# Patient Record
Sex: Male | Born: 1965 | Race: White | Hispanic: No | Marital: Single | Smoking: Former smoker
Health system: Southern US, Community
[De-identification: ages and names within clinical notes are randomized; demographics above are authoritative.]

## PROBLEM LIST (undated history)

## (undated) DIAGNOSIS — T7840XA Allergy, unspecified, initial encounter: Secondary | ICD-10-CM

## (undated) DIAGNOSIS — F419 Anxiety disorder, unspecified: Secondary | ICD-10-CM

## (undated) DIAGNOSIS — D649 Anemia, unspecified: Secondary | ICD-10-CM

## (undated) DIAGNOSIS — G5600 Carpal tunnel syndrome, unspecified upper limb: Secondary | ICD-10-CM

## (undated) DIAGNOSIS — M797 Fibromyalgia: Secondary | ICD-10-CM

## (undated) DIAGNOSIS — G47419 Narcolepsy without cataplexy: Secondary | ICD-10-CM

## (undated) DIAGNOSIS — F32A Depression, unspecified: Secondary | ICD-10-CM

## (undated) DIAGNOSIS — F329 Major depressive disorder, single episode, unspecified: Secondary | ICD-10-CM

## (undated) DIAGNOSIS — I10 Essential (primary) hypertension: Secondary | ICD-10-CM

## (undated) DIAGNOSIS — G43909 Migraine, unspecified, not intractable, without status migrainosus: Secondary | ICD-10-CM

## (undated) DIAGNOSIS — G709 Myoneural disorder, unspecified: Secondary | ICD-10-CM

## (undated) HISTORY — PX: GASTRIC BYPASS: SHX52

## (undated) HISTORY — DX: Anemia, unspecified: D64.9

## (undated) HISTORY — DX: Anxiety disorder, unspecified: F41.9

## (undated) HISTORY — DX: Essential (primary) hypertension: I10

## (undated) HISTORY — DX: Allergy, unspecified, initial encounter: T78.40XA

## (undated) HISTORY — DX: Depression, unspecified: F32.A

## (undated) HISTORY — DX: Fibromyalgia: M79.7

## (undated) HISTORY — DX: Myoneural disorder, unspecified: G70.9

## (undated) HISTORY — DX: Narcolepsy without cataplexy: G47.419

## (undated) HISTORY — DX: Major depressive disorder, single episode, unspecified: F32.9

## (undated) HISTORY — DX: Carpal tunnel syndrome, unspecified upper limb: G56.00

## (undated) HISTORY — DX: Migraine, unspecified, not intractable, without status migrainosus: G43.909

---

## 1998-02-08 ENCOUNTER — Emergency Department (HOSPITAL_COMMUNITY): Admission: EM | Admit: 1998-02-08 | Discharge: 1998-02-08 | Payer: Self-pay | Admitting: Emergency Medicine

## 1998-11-09 ENCOUNTER — Ambulatory Visit (HOSPITAL_COMMUNITY): Admission: RE | Admit: 1998-11-09 | Discharge: 1998-11-09 | Payer: Self-pay

## 1998-11-09 ENCOUNTER — Encounter: Payer: Self-pay | Admitting: Family Medicine

## 1999-08-16 HISTORY — PX: GASTRIC BYPASS: SHX52

## 1999-10-17 ENCOUNTER — Encounter: Payer: Self-pay | Admitting: Emergency Medicine

## 1999-10-17 ENCOUNTER — Emergency Department (HOSPITAL_COMMUNITY): Admission: EM | Admit: 1999-10-17 | Discharge: 1999-10-17 | Payer: Self-pay | Admitting: Emergency Medicine

## 1999-10-18 ENCOUNTER — Emergency Department (HOSPITAL_COMMUNITY): Admission: EM | Admit: 1999-10-18 | Discharge: 1999-10-19 | Payer: Self-pay | Admitting: Emergency Medicine

## 1999-11-20 ENCOUNTER — Encounter: Payer: Self-pay | Admitting: Internal Medicine

## 1999-11-20 ENCOUNTER — Emergency Department (HOSPITAL_COMMUNITY): Admission: EM | Admit: 1999-11-20 | Discharge: 1999-11-20 | Payer: Self-pay | Admitting: Internal Medicine

## 1999-11-22 ENCOUNTER — Emergency Department (HOSPITAL_COMMUNITY): Admission: EM | Admit: 1999-11-22 | Discharge: 1999-11-22 | Payer: Self-pay | Admitting: Emergency Medicine

## 1999-11-22 ENCOUNTER — Encounter: Payer: Self-pay | Admitting: Emergency Medicine

## 1999-11-22 ENCOUNTER — Encounter: Admission: RE | Admit: 1999-11-22 | Discharge: 1999-11-22 | Payer: Self-pay | Admitting: Urology

## 1999-11-22 ENCOUNTER — Encounter: Payer: Self-pay | Admitting: Urology

## 1999-11-25 ENCOUNTER — Ambulatory Visit (HOSPITAL_COMMUNITY): Admission: RE | Admit: 1999-11-25 | Discharge: 1999-11-25 | Payer: Self-pay | Admitting: Urology

## 1999-11-25 ENCOUNTER — Encounter: Payer: Self-pay | Admitting: Urology

## 1999-11-25 ENCOUNTER — Emergency Department (HOSPITAL_COMMUNITY): Admission: EM | Admit: 1999-11-25 | Discharge: 1999-11-25 | Payer: Self-pay | Admitting: Emergency Medicine

## 1999-12-02 ENCOUNTER — Encounter: Admission: RE | Admit: 1999-12-02 | Discharge: 1999-12-02 | Payer: Self-pay | Admitting: Urology

## 1999-12-02 ENCOUNTER — Encounter: Payer: Self-pay | Admitting: Urology

## 2009-12-24 ENCOUNTER — Ambulatory Visit (HOSPITAL_COMMUNITY): Admission: RE | Admit: 2009-12-24 | Discharge: 2009-12-24 | Payer: Self-pay | Admitting: Urology

## 2010-05-18 ENCOUNTER — Ambulatory Visit: Payer: Self-pay | Admitting: Pulmonary Disease

## 2010-05-18 DIAGNOSIS — G4710 Hypersomnia, unspecified: Secondary | ICD-10-CM

## 2010-05-18 DIAGNOSIS — G4730 Sleep apnea, unspecified: Secondary | ICD-10-CM | POA: Insufficient documentation

## 2010-05-21 ENCOUNTER — Telehealth: Payer: Self-pay | Admitting: Pulmonary Disease

## 2010-05-24 ENCOUNTER — Telehealth: Payer: Self-pay | Admitting: Pulmonary Disease

## 2010-05-31 ENCOUNTER — Encounter: Payer: Self-pay | Admitting: Pulmonary Disease

## 2010-06-14 ENCOUNTER — Encounter: Payer: Self-pay | Admitting: Pulmonary Disease

## 2010-06-14 ENCOUNTER — Ambulatory Visit (HOSPITAL_BASED_OUTPATIENT_CLINIC_OR_DEPARTMENT_OTHER): Admission: RE | Admit: 2010-06-14 | Discharge: 2010-06-14 | Payer: Self-pay | Admitting: Pulmonary Disease

## 2010-06-15 ENCOUNTER — Ambulatory Visit: Payer: Self-pay | Admitting: Pulmonary Disease

## 2010-06-15 ENCOUNTER — Telehealth: Payer: Self-pay | Admitting: Pulmonary Disease

## 2010-06-22 ENCOUNTER — Ambulatory Visit: Payer: Self-pay | Admitting: Pulmonary Disease

## 2010-06-28 ENCOUNTER — Ambulatory Visit: Payer: Self-pay | Admitting: Pulmonary Disease

## 2010-06-28 DIAGNOSIS — G47411 Narcolepsy with cataplexy: Secondary | ICD-10-CM | POA: Insufficient documentation

## 2010-06-28 DIAGNOSIS — G8929 Other chronic pain: Secondary | ICD-10-CM

## 2010-08-16 ENCOUNTER — Encounter
Admission: RE | Admit: 2010-08-16 | Discharge: 2010-08-16 | Payer: Self-pay | Source: Home / Self Care | Attending: Physical Medicine & Rehabilitation | Admitting: Physical Medicine & Rehabilitation

## 2010-08-16 ENCOUNTER — Ambulatory Visit: Payer: Self-pay | Admitting: Physical Medicine & Rehabilitation

## 2010-08-30 ENCOUNTER — Encounter
Admission: RE | Admit: 2010-08-30 | Discharge: 2010-09-14 | Payer: Self-pay | Source: Home / Self Care | Attending: Physical Medicine & Rehabilitation | Admitting: Physical Medicine & Rehabilitation

## 2010-09-01 ENCOUNTER — Ambulatory Visit (HOSPITAL_COMMUNITY)
Admission: RE | Admit: 2010-09-01 | Discharge: 2010-09-01 | Payer: Self-pay | Source: Home / Self Care | Attending: Physical Medicine & Rehabilitation | Admitting: Physical Medicine & Rehabilitation

## 2010-09-02 ENCOUNTER — Encounter: Payer: Self-pay | Admitting: Pulmonary Disease

## 2010-09-03 ENCOUNTER — Ambulatory Visit
Admission: RE | Admit: 2010-09-03 | Discharge: 2010-09-03 | Payer: Self-pay | Source: Home / Self Care | Attending: Physical Medicine & Rehabilitation | Admitting: Physical Medicine & Rehabilitation

## 2010-09-12 LAB — CONVERTED CEMR LAB
Amphetamine Screen, Ur: POSITIVE — AB
Barbiturate Quant, Ur: NEGATIVE
Cocaine Metabolites: NEGATIVE
Ethyl Alcohol: <10 mg/dL (ref <10)
Marijuana Metabolite: NEGATIVE
Methadone: NEGATIVE
Phencyclidine (PCP): NEGATIVE
Propoxyphene: NEGATIVE

## 2010-09-14 NOTE — Letter (Signed)
Summary: [facility]  [facility]   Imported By: Lester [place] 06/10/2010 08:36:17  _____________________________________________________________________  External Attachment:    Type:   Image     Comment:   External Document

## 2010-09-14 NOTE — Progress Notes (Signed)
Summary: C PAP MACHINE  Phone Note Call from Patient Call back at [phone] OR [phone]   Caller: Patient Call For: ALVA Summary of Call: PT CALLING ABOUT C PAP MACHINE Initial call taken by: Rickard Patience,  May 24, 2010 2:41 PM  Follow-up for Phone Call        spoke to [email or web address]  she found the order pt should be called on 05/25/10 Follow-up by: Oneita Jolly,  May 24, 2010 5:08 PM  Additional Follow-up for Phone Call Additional follow up Details #1::        called and spoke with pts mother and she is aware that this will be taken care of in the am by [facility]--explained to her if she did not hear from them by tomorrow afternoon to call us back. Randell Loop CMA  May 24, 2010 5:10 PM

## 2010-09-14 NOTE — Assessment & Plan Note (Signed)
Summary: sleep consult//jrc   Visit Type:  Initial Consult Copy to:  self Primary Provider/Referring Provider:  Dr.Denise Providence Lanius  CC:  Pt here for sleep consult.  History of Present Illness: 44/M for evaluation of obstructive sleep apnea . He is originally from here, moved back from [facility] to [place] 2 yrs ago, planning to move to [place]  - wife works for [employer]. His supplier was [facility] in [place]. PSG in 2006 , CPAP 11.5  with pillows, broke down a week ago, plastic broke off the hose,  c/o slurred speech since too sleepy, He is concerned that he may have narcolepsy -   no energy, weight gain - psychiatrist, Dr Milagros Evener. - 272 1972 med history - dependent on xanax, now on valium since father passed away lyrica for joint pain on symbyax for depression, ? bipolar  He reports history suggestive of cataplexy -fell down stairs x1, startled - fell out of bed - trying to get up from sleep at 3 A, He describes an episode when wife was ringing door bell, he was surprised & lost balance.  Epworth Sleepiness Score 15. Bedtime around MN, latency 8 mins, wakes up 1-2 times, oob at 0630, with headache. Takes 3-4 goody powder/ day Has gained 40 lbs in the last year . He lost from 310 to 10 lbs after his bypass, but regained.  Preventive Screening-Counseling & Management  Alcohol-Tobacco     Alcohol drinks/day: <1     Smoking Status: current     Packs/Day: 1.5     Year Started: 1984   History of Present Illness: current sleep apnea with broken machine from 2006 [place])  What time do you typically go to bed?(between what hours): 11:30-12  How long does it take you to fall asleep?  How many times during the night do you wake up? 1-2  What time do you get out of bed to start your day? 6:30am  Do you drive or operate heavy machinery in your occupation? n/a  How much has your weight changed (up or down) over the past two years? (in pounds): gained 40lbs  Have you ever had a sleep  study before?  If yes,[place] and [facility] [place]  Do you currently use CPAP ? If so , at what pressure? yes 11.5   Do you wear oxygen at any time? If yes, how many liters per minute? no Current Medications (verified): 1)  Goodys Extra Strength 520-260-32.5 Mg Pack (Aspirin-Acetaminophen-Caffeine) .... 4 To 6 Daily As Needed 2)  Lyrica 75 Mg Caps (Pregabalin) .... Take 1 Tablet By Mouth Every Morning and Take 2 Tab By Mouth At Bedtime 3)  Symbyax 6-25 Mg Caps (Olanzapine-Fluoxetine Hcl) .... Take 1 Tablet By Mouth Once A Day 4)  Nuvigil 250 Mg Tabs (Armodafinil) .... Take 1 Tablet By Mouth Every Morning 5)  Propranolol Hcl 20 Mg Tabs (Propranolol Hcl) .... Take 1 Tablet By Mouth Once A Day As Needed 6)  Trazodone Hcl 150 Mg Tabs (Trazodone Hcl) .... Take 1/2 Tab By Mouth At Bedtime 7)  Valium 10 Mg Tabs (Diazepam) .... Take  1 Tablet By Mouth Up To Four Times A Day 8)  Cpap .Marland Kitchen.. 11.5 9)  Ritalin 5 Mg Tabs (Methylphenidate Hcl) .... 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  Packs/Day:  1.5 Smoking Status:  current Alcohol drinks/day:  <1  Review of Systems       The patient complains of productive cough, acid heartburn, abdominal pain, tooth/dental problems, headaches, nasal congestion/difficulty breathing through nose, sneezing, itching, ear ache, anxiety, depression, and joint stiffness or pain.  The patient denies shortness of breath with activity, shortness of breath at  rest, non-productive cough, coughing up blood, chest pain, irregular heartbeats, indigestion, loss of appetite, weight change, difficulty swallowing, sore throat, hand/feet swelling, rash, change in color of mucus, and fever.    Vital Signs:  Patient profile:   45 year old male Height:      70 inches Weight:      283 pounds BMI:     40.75 O2 Sat:      98 % on Room air Temp:     98.8 degrees F oral Pulse rate:   93 / minute BP sitting:   126 / 80  (left arm) Cuff size:   regular  Vitals Entered By: Zackery Barefoot CMA (May 18, 2010 9:50 AM)  O2 Flow:  Room air CC: Pt here for sleep consult Comments Medications reviewed with patient Verified contact number and pharmacy with patient Zackery Barefoot CMA  May 18, 2010 9:50 AM    Physical Exam  Additional Exam:  wt 283 May 18, 2010  Gen. obese, sleepy, in no distress, normal affect ENT - no lesions, no post nasal drip, class 3 airway Neck: No JVD, no thyromegaly, no carotid bruits Lungs: no use of accessory muscles, no dullness to percussion, clear without rales or rhonchi  Cardiovascular: Rhythm regular, heart sounds  normal, no murmurs or gallops, no peripheral edema Abdomen: soft and non-tender, no hepatosplenomegaly, BS normal. Musculoskeletal: No deformities, no cyanosis or clubbing Neuro:  alert, non focal     Impression & Recommendations:  Problem # 1:  SLEEP APNEA (ICD-780.57) Since we are unable to obtian his studies , will have to redefine . The pathophysiology of obstructive sleep apnea, it's cardiovascular consequences and modes of treatment including CPAP were discussed with the patient in great detail.  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 Does not need nuvigil in addition to  ritalin.  Orders: Consultation Level IV (60454)  Medications Added to Medication List This Visit: 1)  Goodys Extra Strength 520-260-32.5 Mg Pack (Aspirin-acetaminophen-caffeine) .... 4 to 6 daily as needed 2)  Lyrica 75 Mg Caps (Pregabalin) .... Take 1 tablet by mouth every morning and take 2 tab by mouth at bedtime 3)  Symbyax 6-25 Mg Caps (Olanzapine-fluoxetine hcl) .... Take 1 tablet by mouth once a day 4)  Nuvigil 250 Mg Tabs (Armodafinil) .... Take 1 tablet by mouth every morning 5)  Propranolol Hcl 20 Mg Tabs (Propranolol hcl) .... Take 1 tablet by mouth once a day as needed 6)  Trazodone Hcl 150 Mg Tabs (Trazodone hcl) .... Take 1/2 tab by mouth at bedtime 7)  Valium 10 Mg Tabs (Diazepam) .... Take  1 tablet by mouth up to four times a day 8)  Cpap  .Marland Kitchen.. 11.5 9)  Ritalin  5 Mg Tabs (Methylphenidate hcl) .... 2 tab by mouth up to three times a day  Patient Instructions: 1)  Copy sent to:dr kaur, dr Providence [facility]ius [facility] [place] 2)  Please schedule a follow-up appointment in 1 month after sleep study   Immunization History:  Influenza Immunization History:    Influenza:  historical (05/18/2009)

## 2010-09-14 NOTE — Assessment & Plan Note (Signed)
Summary: rov/pt to arrive @ 9:10am/ok to double per RA/jwr   Visit Type:  Follow-up Copy to:  self Primary Provider/Referring Provider:  Dr.Denise Providence Lanius  CC:  Patient here for sleep study follow-up.Marland Kitchen  History of Present Illness: 44/M for evaluation of obstructive sleep apnea . He is originally from here, moved back from [facility] to [place] 2 yrs ago, planning to move to [place]  - wife works for [employer]. His supplier was [facility] in [place]. PSG in 2006 , CPAP 11.5  with pillows, broke down a week ago, plastic broke off the hose,   med history - dependent on xanax, now on valium since father passed away lyrica for joint pain on symbyax for depression, ? bipolar  He reports history suggestive of cataplexy -fell down stairs x1, startled - fell out of bed - trying to get up from sleep at 3 A, He describes an episode when wife was ringing door bell, he was surprised & lost balance. Epworth Sleepiness Score 15.  Has gained 40 lbs in the last year . He lost from 310 to 10 lbs after his bypass, but regained.  June 28, 2010 10:14 AM  Discussed -PSG surprisingly did no show significant events, no REM noted. MSLT showed very short sleep latency & 2 SOREMs c/w narcolepsy. He does report subjective improvement with CPAP. He has changed to adderall now from ritalin (given by Psych)  Current Medications (verified): 1)  Goodys Extra Strength 520-260-32.5 Mg Pack (Aspirin-Acetaminophen-Caffeine) .... 4 To 6 Daily As Needed 2)  Lyrica 75 Mg Caps (Pregabalin) .... Take 1 Tablet By Mouth Every Morning and Take 2 Tab By Mouth At Bedtime 3)  Symbyax 6-25 Mg Caps (Olanzapine-Fluoxetine Hcl) .... Take 1 Tablet By Mouth Once A Day 4)  Nuvigil 250 Mg Tabs (Armodafinil) .... Take 1 Tablet By Mouth Every Morning 5)  Propranolol Hcl 20 Mg Tabs (Propranolol Hcl) .... Take 1 Tablet By Mouth Once A Day As Needed 6)  Trazodone Hcl 150 Mg Tabs (Trazodone Hcl) .... Take 1/2 Tab By Mouth At Bedtime 7)  Valium 10 Mg Tabs  (Diazepam) .... Take  1 Tablet By Mouth Up To Four Times A Day 8)  Cpap .Marland Kitchen.. 11.5 9)  Ritalin 5 Mg Tabs (Methylphenidate Hcl) .... 2 Tab By Mouth Up To Three Times A Day 10)  Tramadol Hcl 50 Mg Tabs (Tramadol Hcl) .... 2 By Mouth Two Times A Day  Allergies (verified): No Known Drug Allergies  Past History:  Social History: Last updated: 05/18/2010 Marital Status: Married Children: yes Occupation: unemployed Patient is a current smoker.   Risk Factors: Smoking Status: current (05/18/2010) Packs/Day: 1.5 (05/18/2010)  Past Medical History: Sleep Apnea Chronic Headaches psychiatrist, Dr Milagros Evener. - [phone]  Review of Systems  The patient denies anorexia, fever, weight loss, weight gain, vision loss, decreased hearing, hoarseness, chest pain, syncope, dyspnea on exertion, peripheral edema, prolonged cough, headaches, hemoptysis, abdominal pain, melena, hematochezia, severe indigestion/heartburn, hematuria, muscle weakness, suspicious skin lesions, difficulty walking, depression, unusual weight change, abnormal bleeding, enlarged lymph nodes, and angioedema.    Vital Signs:  Patient profile:   45 year old male Height:      70 inches (177.80 cm) Weight:      286.25 pounds (130.11 kg) BMI:     41.22 O2 Sat:      94 % on Room air Temp:     98.1 degrees F (36.72 degrees C) oral Pulse rate:   81 / minute BP sitting:   108 /  70  (left arm) Cuff size:   large  Vitals Entered By: Michel Bickers CMA (June 28, 2010 9:28 AM)  O2 Sat at Rest %:  94 O2 Flow:  Room air CC: Patient here for sleep study follow-up. Comments Medications reviewed with patient Michel Bickers  [facility]  June 28, 2010 9:28 AM   Physical Exam  Additional Exam:  wt 283 May 18, 2010  Gen. 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   Orders: Est. Patient Level IV (95621)  Problem # 2:  OTHER CHRONIC PAIN (ICD-338.29) He requests pain clinic referral. All his psych meds are prescribed by Dr Evelene Croon Orders: Est. Patient Level IV (30865) Pain Clinic Referral (Pain)  Problem # 3:  SLEEP APNEA (ICD-780.57) Surprisingly, obstructive sleep apnea is mild.  However he would like to keep sleeping with his CPAP.  Medications Added to Medication List This Visit: 1)  Tramadol Hcl 50 Mg Tabs (Tramadol hcl) .... 2 by mouth two times a day 2)  Adderall 5 Mg Tabs (Amphetamine-dextroamphetamine) .... 4 times/d ay as needed  Patient Instructions: 1)  Copy sent to: Dr Evelene Croon, [facility] 2)  Please schedule a follow-up appointment in 3 months. 3)  Sleep diary 4)  You may have narcolepsy 5)  Fixed bedtime & wake up times 6)  Frequent naps 1hr at least twice/day

## 2010-09-14 NOTE — Progress Notes (Signed)
Summary: cpap/ parts  Phone Note Call from Patient   Caller: Mom- Kevin Reed Call For: ALVA Summary of Call: pt's mom wants to know status of finding info re: where pt got his cpap yet? pt needs a new auto pap and humidifier. mom concerned that pt's speech is very slurred (she says due to lack of sleep) and needs this cpap asap. says she went over this with nurse at time of visit and they were to call back. [phone] or [phone] Initial call taken by: Tivis Ringer, CNA,  May 21, 2010 10:11 AM  Follow-up for Phone Call        called and spoke with pt's mother, Kevin Reed.  Kevin states pt was just seen by RA on 05/18/2010 for sleep consult.  Kevin states nurse/md were going to try to locate sleep study pt had done in [place].  Kevin calling to check on the status of this.  Will forward message to Shanda Bumps to see if she was able to locate sleep study.  Arman Filter LPN  May 21, 2010 10:40 AM   [facility] in [place] and they advised no record of same dating back to 2000. Another phone note has already addressed pt's machine. Will sign off on this note. Kevin Reed CMA  May 26, 2010 11:05 AM

## 2010-09-14 NOTE — Progress Notes (Signed)
Summary: drug screen  Phone Note Call from Patient   Caller: [facility] Call For: alva Summary of Call: pt had paper that said do drug test but they need an order with dx codes for which test needs to be done [phone] Initial call taken by: Oneita Jolly,  June 15, 2010 12:08 PM  Follow-up for Phone Call        order placed by Dr. Vassie Loll for sleep study states ok to have urine drug screen prior to study. Lab needed this entrered into [facility]. It has been entered. Carron Curie CMA  June 15, 2010 3:22 PM

## 2010-09-15 ENCOUNTER — Ambulatory Visit: Payer: Self-pay | Admitting: Physical Therapy

## 2010-09-16 NOTE — Letter (Signed)
SummaryScience writer Pulmonary Care Appointment Letter  [facility]  [address]   [place], [place] [address]   Phone: [phone]  Fax: [phone]    09/02/2010 MRN: [id number]  Kevin Reed [address] [place], [place]  [address]  Dear [name],   Our office is attempting to contact you about an appointment.  Please call our office at [phone] to schedule this appointment with Dr.Jachai Okazaki.  Our registration staff is prepared to assist you with any questions you may have.    Thank you,   Nature conservation officer Pulmonary Division

## 2010-10-04 ENCOUNTER — Ambulatory Visit: Payer: BC Managed Care – PPO | Attending: Physical Medicine & Rehabilitation | Admitting: Physical Therapy

## 2010-10-04 DIAGNOSIS — M545 Low back pain, unspecified: Secondary | ICD-10-CM | POA: Insufficient documentation

## 2010-10-04 DIAGNOSIS — M25569 Pain in unspecified knee: Secondary | ICD-10-CM | POA: Insufficient documentation

## 2010-10-04 DIAGNOSIS — IMO0001 Reserved for inherently not codable concepts without codable children: Secondary | ICD-10-CM | POA: Insufficient documentation

## 2010-10-04 DIAGNOSIS — M25539 Pain in unspecified wrist: Secondary | ICD-10-CM | POA: Insufficient documentation

## 2010-10-06 ENCOUNTER — Encounter: Payer: Self-pay | Admitting: Pulmonary Disease

## 2010-10-06 ENCOUNTER — Ambulatory Visit (INDEPENDENT_AMBULATORY_CARE_PROVIDER_SITE_OTHER): Payer: BC Managed Care – PPO | Admitting: Pulmonary Disease

## 2010-10-06 DIAGNOSIS — G47411 Narcolepsy with cataplexy: Secondary | ICD-10-CM

## 2010-10-06 DIAGNOSIS — G4730 Sleep apnea, unspecified: Secondary | ICD-10-CM

## 2010-10-08 ENCOUNTER — Ambulatory Visit: Payer: BC Managed Care – PPO | Admitting: Physical Medicine & Rehabilitation

## 2010-10-08 ENCOUNTER — Encounter: Payer: BC Managed Care – PPO | Attending: Physical Medicine & Rehabilitation

## 2010-10-08 DIAGNOSIS — G5600 Carpal tunnel syndrome, unspecified upper limb: Secondary | ICD-10-CM | POA: Insufficient documentation

## 2010-10-08 DIAGNOSIS — G5610 Other lesions of median nerve, unspecified upper limb: Secondary | ICD-10-CM

## 2010-10-11 ENCOUNTER — Telehealth (INDEPENDENT_AMBULATORY_CARE_PROVIDER_SITE_OTHER): Payer: Self-pay | Admitting: *Deleted

## 2010-10-21 ENCOUNTER — Telehealth (INDEPENDENT_AMBULATORY_CARE_PROVIDER_SITE_OTHER): Payer: Self-pay | Admitting: *Deleted

## 2010-10-21 NOTE — Progress Notes (Signed)
  Phone Note Other Incoming   Request: Send information Summary of Call: Received request from [employer], request forwarded to [facility].

## 2010-10-21 NOTE — Assessment & Plan Note (Signed)
Summary: follow up   Copy to:  self Primary Provider/Referring Provider:  Dr.Denise Providence Lanius  CC:  Pt here of follow up. c/o night sweats x 2 weeks and daytime sleepiness.  History of Present Illness: 45/M for FU of excessive daytime somnolence due to narcolepsy & obstructive sleep apnea. He is originally from here, moved back from [facility] to [place] 2 yrs ago, planning to move to [place]  - wife works for [employer]. His supplier was [facility] in [place]. After PSG in 2006 , he has been maintained on CPAP 11.5  with pillows.  med history - dependent on xanax, now on valium since father passed away, lyrica for joint pain on symbyax for depression, ? bipolar  He reports history suggestive of cataplexy -fell down stairs x1, startled - fell out of bed - trying to get up from sleep at 3 A, He describes an episode when wife was ringing door bell, he was surprised & lost balance. Epworth Sleepiness Score 15.  Has gained 40 lbs in the last year . He lost from 310 to 10 lbs after his bypass, but regained.  June 28, 2010 10:14 AM  Discussed -PSG surprisingly did no show significant events, no REM noted. MSLT showed very short sleep latency & 2 SOREMs c/w narcolepsy. He does report subjective improvement with CPAP. He has changed to adderall now from ritalin (given by Psych)  October 06, 2010 11:27 AM  taking valium 4 /day, needing adderall 3-4 / day - today is a sleepy day, woke up early. Nuvigil when he can fill RX - too expensive Reports compliance with CPAP, on auto  Preventive Screening-Counseling & Management  Alcohol-Tobacco     Alcohol drinks/day: <1     Smoking Status: current     Packs/Day: 1.5     Year Started: 1984  Current Medications (verified): 1)  Goodys Extra Strength 520-260-32.5 Mg Pack (Aspirin-Acetaminophen-Caffeine) .... 4 To 6 Daily As Needed 2)  Symbyax 6-25 Mg Caps (Olanzapine-Fluoxetine Hcl) .... Take 1 Tablet By Mouth Once A Day 3)  Nuvigil 250 Mg Tabs (Armodafinil) ....  Take 1 Tablet By Mouth Every Morning 4)  Propranolol Hcl 20 Mg Tabs (Propranolol Hcl) .... Take 1 Tablet By Mouth Once A Day As Needed 5)  Trazodone Hcl 150 Mg Tabs (Trazodone Hcl) .... Take 1/2 Tab By Mouth At Bedtime 6)  Valium 10 Mg Tabs (Diazepam) .... Take  1 Tablet By Mouth Up To Four Times A Day 7)  Cpap .Marland Kitchen.. 11.5 8)  Tramadol Hcl 50 Mg Tabs (Tramadol Hcl) .... 2 By Mouth Two Times A Day 9)  Naprosyn 500 Mg Tabs (Naproxen) .... Take 1 Tablet By Mouth Two Times A Day 10)  Adderall 30 Mg Tabs (Amphetamine-Dextroamphetamine) .... Take 1 Tablet By Mouth Three Times A Day  Allergies (verified): No Known Drug Allergies  Past History:  Past Medical History: Last updated: 06/28/2010 Sleep Apnea Chronic Headaches psychiatrist, Dr Milagros Evener. - 272 1972  Social History: Last updated: 05/18/2010 Marital Status: Married Children: yes Occupation: unemployed Patient is a current smoker.   Review of Systems  The patient denies anorexia, fever, weight loss, weight gain, vision loss, decreased hearing, hoarseness, chest pain, syncope, dyspnea on exertion, peripheral edema, prolonged cough, headaches, hemoptysis, abdominal pain, melena, hematochezia, severe indigestion/heartburn, hematuria, muscle weakness, suspicious skin lesions, transient blindness, difficulty walking, depression, unusual weight change, abnormal bleeding, enlarged lymph nodes, and angioedema.    Vital Signs:  Patient profile:   45 year old male Height:  70 inches Weight:      288 pounds BMI:     41.47 O2 Sat:      93 % on Room air Temp:     98.1 degrees F oral Pulse rate:   88 / minute BP sitting:   112 / 76  (left arm) Cuff size:   large  Vitals Entered By: Zackery Barefoot CMA (October 06, 2010 11:14 AM)  O2 Flow:  Room air CC: Pt here of follow up. c/o night sweats x 2 weeks, daytime sleepiness Comments Medications reviewed with patient Verified contact number and pharmacy with patient Zackery Barefoot [facility]  October 06, 2010 11:17 AM    Physical Exam  Additional Exam:  wt 283 May 18, 2010 288 October 07, 2010  Gen. 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    Orders: Est. Patient Level III (32440)  Problem # 2:  SLEEP APNEA (ICD-780.57)  Surprisingly, obstructive sleep apnea is mild.  However he would like to keep sleeping with his CPAP. He is compliant by report. excessive daytime somnolence is multifactorial realted to narcolepsy & perhaps to his many medications - defer to psych for prescriptions  Orders: Est. Patient Level III (10272)  Medications Added to Medication List This Visit: 1)  Naprosyn 500 Mg Tabs (Naproxen) .... Take 1 tablet by mouth two times a day 2)  Adderall 30 Mg Tabs (Amphetamine-dextroamphetamine) .... Take 1 tablet by mouth three times a day  Patient Instructions: 1)  Copy sent to: Dr Evelene Croon, Dr Providence Lanius 2)  Please schedule a follow-up appointment in 1 year.

## 2010-10-26 NOTE — Progress Notes (Signed)
  Phone Note Other Incoming   Request: Send information Summary of Call: Request for records received from [facility]. Request forwarded to [facility].     

## 2010-11-15 ENCOUNTER — Ambulatory Visit: Payer: BC Managed Care – PPO | Admitting: Physical Medicine & Rehabilitation

## 2010-11-15 ENCOUNTER — Encounter: Payer: BC Managed Care – PPO | Attending: Physical Medicine & Rehabilitation

## 2010-11-15 DIAGNOSIS — G5610 Other lesions of median nerve, unspecified upper limb: Secondary | ICD-10-CM

## 2010-11-15 DIAGNOSIS — G5600 Carpal tunnel syndrome, unspecified upper limb: Secondary | ICD-10-CM | POA: Insufficient documentation

## 2010-12-07 ENCOUNTER — Emergency Department (HOSPITAL_COMMUNITY): Payer: No Typology Code available for payment source

## 2010-12-07 ENCOUNTER — Emergency Department (HOSPITAL_COMMUNITY)
Admission: EM | Admit: 2010-12-07 | Discharge: 2010-12-07 | Disposition: A | Discharge status: Home / Self Care | Payer: No Typology Code available for payment source | Attending: Emergency Medicine | Admitting: Emergency Medicine

## 2010-12-07 DIAGNOSIS — IMO0001 Reserved for inherently not codable concepts without codable children: Secondary | ICD-10-CM | POA: Insufficient documentation

## 2010-12-07 DIAGNOSIS — Y92410 Unspecified street and highway as the place of occurrence of the external cause: Secondary | ICD-10-CM | POA: Insufficient documentation

## 2010-12-07 DIAGNOSIS — T1490XA Injury, unspecified, initial encounter: Secondary | ICD-10-CM | POA: Insufficient documentation

## 2010-12-07 DIAGNOSIS — M542 Cervicalgia: Secondary | ICD-10-CM | POA: Insufficient documentation

## 2010-12-13 ENCOUNTER — Ambulatory Visit: Payer: BC Managed Care – PPO | Admitting: Physical Medicine & Rehabilitation

## 2010-12-13 DIAGNOSIS — F329 Major depressive disorder, single episode, unspecified: Secondary | ICD-10-CM

## 2010-12-13 DIAGNOSIS — G5610 Other lesions of median nerve, unspecified upper limb: Secondary | ICD-10-CM

## 2010-12-13 DIAGNOSIS — IMO0001 Reserved for inherently not codable concepts without codable children: Secondary | ICD-10-CM

## 2010-12-13 DIAGNOSIS — S139XXA Sprain of joints and ligaments of unspecified parts of neck, initial encounter: Secondary | ICD-10-CM
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
## 2010-12-17 ENCOUNTER — Ambulatory Visit: Payer: BC Managed Care – PPO | Admitting: Physical Medicine & Rehabilitation

## 2011-01-14 ENCOUNTER — Telehealth: Payer: Self-pay | Admitting: Pulmonary Disease

## 2011-01-14 NOTE — Telephone Encounter (Signed)
Pt states the only thing missing from his forms were vision related and his ophthalmologist  took care of this for him.

## 2011-01-27 ENCOUNTER — Encounter: Payer: No Typology Code available for payment source | Attending: Physical Medicine & Rehabilitation

## 2011-01-27 ENCOUNTER — Ambulatory Visit: Payer: BC Managed Care – PPO | Admitting: Physical Medicine & Rehabilitation

## 2011-01-27 DIAGNOSIS — G5600 Carpal tunnel syndrome, unspecified upper limb: Secondary | ICD-10-CM

## 2011-01-28 NOTE — Procedures (Signed)
NAMEHAKIM, Kevin Reed NO.:  [id number]  MEDICAL RECORD NO.:  [id number]           PATIENT TYPE:  O  LOCATION:  [facility]                          FACILITY:  [facility]  PHYSICIAN:  Erick Colace, M.D.DATE OF BIRTH:  Jul 04, 1966  DATE OF PROCEDURE: DATE OF DISCHARGE:                              OPERATIVE REPORT  This is a left carpal tunnel injection.  PROCEDURE:  Left carpal tunnel injection.  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  Informed consent was obtained after describing risks and benefits of the procedure with the patient.  These include bleeding, bruising and infection.  He elects to proceed and has given written consent.  The patient placed in a seated position.  Area marked and prepped with alcohol and Betadine skin wheal raised with 0.25 mL lidocaine 1% using a 30-gauge 1-1/2-inch needle.  Then, another 30-gauge, 1-1/2-inch needle was inserted into the carpal tunnel, 0.25 mL of 40 mg/mL Depo-Medrol injected.  The patient tolerated the procedure well.  Postprocedure instructions given.     Erick Colace, M.D. Electronically Signed    AEK/MEDQ  D:  01/27/2011 14:27:40  T:  01/27/2011 23:59:19  Job:  [id number]  cc:   Melvyn Novas, M.D. Fax: [phone]

## 2011-02-11 ENCOUNTER — Ambulatory Visit: Payer: BC Managed Care – PPO | Admitting: Physical Medicine & Rehabilitation

## 2011-02-14 ENCOUNTER — Ambulatory Visit: Payer: BC Managed Care – PPO | Admitting: Physical Medicine & Rehabilitation

## 2011-02-24 ENCOUNTER — Encounter: Payer: BC Managed Care – PPO | Attending: Physical Medicine & Rehabilitation

## 2011-02-24 ENCOUNTER — Ambulatory Visit: Payer: BC Managed Care – PPO | Admitting: Physical Medicine & Rehabilitation

## 2011-02-24 DIAGNOSIS — G90529 Complex regional pain syndrome I of unspecified lower limb: Secondary | ICD-10-CM

## 2011-02-24 DIAGNOSIS — G5600 Carpal tunnel syndrome, unspecified upper limb: Secondary | ICD-10-CM | POA: Insufficient documentation

## 2011-02-24 NOTE — Assessment & Plan Note (Signed)
REASON FOR VISIT:  The patient needs DMV forms filled out regarding any musculoskeletal problems associated that may impact his driving.  A 45 year old male whose primary pain diagnosis is fibromyalgia syndrome.  He also has carpal tunnel syndrome.  His carpal tunnel syndrome improved after injections under ultrasound guidance performed November 15, 2010 and December 13, 2010.  He has had no new medical problems in the interval time.  He is planning followup with Dr. Vickey Huger in regards to his sleep.  He states Dr. Vickey Huger told me he does not have narcolepsy likely just has hypersomnolence.  The patient states that he may follow up with her rather than Dr. Vassie Loll.  The sleep study from June 22, 2010, showed no significant sleep disordered breathing to explain his daytime somnolence.  REVIEW OF SYSTEMS:  Numbness, tingling, trouble walking, dizziness, depression and anxiety.  PHYSICAL EXAMINATION:  He has full range bilateral upper and lower extremities.  Full range in the neck and lumbar spine.  He has full strength bilateral upper and lower extremities.  Gait, he is able to ambulate without an assistive device, but when he tries heel walking or toe walking, he had some difficulty maintaining balance without a cane.  IMPRESSION: 1. Fibromyalgia syndrome.  I think this is his main pain complaint. 2. Carpal tunnel syndrome improved after injections, may have     recurrence in the next 1-2 months.  PLAN:  I will see him back in about 3 months.  We can reinject his carpal tunnel before that time if need be.  He will follow with Dr. Vickey Huger in terms of sleep studies which apparently were done while he is on medication rather than off medication.  I did not see anything from musculoskeletal standpoint.  I would limit his driving and would defer to Dr. Vickey Huger in terms of any sleep disorder diagnosis.     Erick Colace, M.D. Electronically Signed    AEK/MedQ D:  02/24/2011  11:17:49  T:  02/24/2011 12:04:32  Job #:  [id number]  cc:   Melvyn Novas, M.D. Fax: [phone]  Milagros Evener, M.D. Fax: [phone]

## 2011-03-29 ENCOUNTER — Ambulatory Visit: Payer: BC Managed Care – PPO | Admitting: Physical Medicine & Rehabilitation

## 2011-03-30 ENCOUNTER — Encounter: Payer: Managed Care, Other (non HMO) | Attending: Physical Medicine & Rehabilitation | Admitting: Neurosurgery

## 2011-03-30 DIAGNOSIS — G894 Chronic pain syndrome: Secondary | ICD-10-CM

## 2011-03-30 DIAGNOSIS — IMO0001 Reserved for inherently not codable concepts without codable children: Secondary | ICD-10-CM

## 2011-03-30 DIAGNOSIS — M545 Low back pain: Secondary | ICD-10-CM

## 2011-03-30 DIAGNOSIS — G5600 Carpal tunnel syndrome, unspecified upper limb: Secondary | ICD-10-CM | POA: Insufficient documentation

## 2011-03-31 NOTE — Assessment & Plan Note (Signed)
Account [id number].  This is a patient of Dr. Wynn Banker who is seen for carpal tunnel as well as fibromyalgia.  He called asking to be seen today for new problem.  He has some left-sided low back pain it is actually in his flank.  He has no radiculopathy symptoms with this at all.  He also has 2+ pitting edema in a stocking format in both lower extremities.  He states he has [place] the beach [place] and he tends to fall asleep setting up due to hypersomnolence.  He is treating with Dr. Vickey Huger for that.  He states he continue to treat with her.  He has had no injury regarding his back.  It is just a kind of coincidental thing regarding his legs, I think that the edema should be addressed by his primary care.  He states he will make an appointment with them as soon as possible, it is most likely fluid in nature, and he knows that is not to do with his back or musculature in his back.  He rates his pain as 7 or 8, it is a tingling, aching pain that is constant.  General activity level is 5-10.  The pain is worse in the morning and at night.  Sleep patterns are poor.  All activities aggravate.  Medication and injections, TENS unit tends to help.  He walks with a cane for stability.  He can climb steps and drive.  He can walk about 15 minutes at a time.  He is unemployed.  He needs help with some ADLs.  REVIEW OF SYSTEMS:  Notable for the difficulties describe above as well as some weight loss, low blood sugars, poor appetite, sleep apnea, confusion, depression, anxiety, dizziness, and paresthesias.  He has no suicidal thoughts or aberrant behavior.  He is taking opiates.  PAST MEDICAL HISTORY:  Unchanged.  SOCIAL HISTORY:  He is divorced. He lives with his mother.  FAMILY HISTORY:  Unchanged.  PHYSICAL EXAMINATION:  VITAL SIGNS:  His blood pressure is 133/68, pulse 87, respirations 16, O2 sats 97 on room air.  His motor strength is 5/5 in the iliopsoas,  quadriceps to confrontation testing.  His sensation is intact.  He is alert and oriented x3.  He is obese.  He does walk with slight limp with a cane for support.  He has 2+ pitting edema bilaterally in the stocking fashion in the lower extremities, and he is slightly point tender to the left flank in his low back.  ASSESSMENT: 1. History of fibromyalgia. 2. Carpal tunnel. 3. Acute on chronic left-sided low back pain that is more elevated     toward the flank area without radiculopathy 4. Lower extremity pitting edema.  PLAN:  He will continue his tramadol and Robaxin as needed.  He also takes Mobic 7.5 mg as needed, no new prescriptions were given.  We did do after informed consent IM Toradol 60 mg to the left upper buttock area.  He states he thinks that will help, he has had it before.  He will follow up with Dr. Wynn Banker scheduled in 1 month.  His questions were encouraged and answered.     Princetta Uplinger L. Blima Dessert Electronically Signed    RLW/MedQ D:  03/30/2011 16:08:48  T:  03/31/2011 01:38:44  Job #:  [id number]

## 2011-05-23 ENCOUNTER — Ambulatory Visit: Payer: Managed Care, Other (non HMO) | Admitting: Physical Medicine & Rehabilitation

## 2011-05-24 ENCOUNTER — Ambulatory Visit: Payer: Managed Care, Other (non HMO) | Admitting: Physical Medicine & Rehabilitation

## 2011-05-24 ENCOUNTER — Encounter: Payer: Managed Care, Other (non HMO) | Attending: Physical Medicine & Rehabilitation

## 2011-05-24 DIAGNOSIS — M719 Bursopathy, unspecified: Secondary | ICD-10-CM

## 2011-05-24 DIAGNOSIS — G5600 Carpal tunnel syndrome, unspecified upper limb: Secondary | ICD-10-CM | POA: Insufficient documentation

## 2011-05-24 DIAGNOSIS — M67919 Unspecified disorder of synovium and tendon, unspecified shoulder: Secondary | ICD-10-CM

## 2011-05-24 DIAGNOSIS — IMO0001 Reserved for inherently not codable concepts without codable children: Secondary | ICD-10-CM

## 2011-05-26 NOTE — Assessment & Plan Note (Signed)
HISTORY:  A 45 year old male who has complaints of left hand and wrist pain as well as right shoulder pain today.  He has evidence of left greater than right median neuropathy at the wrist on an EMG that I performed earlier this year.  He has had temporary relief from median nerve injections, but has had recurrence.  He has a normal arthritis plan, all normal T4 and TSH.  He has both motor and sensory involvement on the left side and only sensory involvement on the right side.  In regards to his right shoulder, he has pain with abduction, pain that is worse in the morning when he first gets up and starts moving his shoulders, pain is described as sharp and stabbing.  Medications, TENs, injections seem to help his pain.  He has been on disability since December 12, 2009.  He needs some help with household duties as well as certain dressing activities.  REVIEW OF SYSTEMS:  Positive for weakness, numbness, tingling, trouble walking, dizziness, confusion, depression, anxiety, nausea, constipation, poor appetite.  OTHER PAST MEDICAL HISTORY:  Fibromyalgia.  SOCIAL HISTORY:  Divorced, lives with his mother, smokes 1 pack per day.  He is worried about losing his health insurance.  He is on his wife's plan and thinks he will have it through the end of the year, but he is currently in process of a divorce.  PHYSICAL EXAMINATION:  VITAL SIGNS:  His blood pressure is 139/78, pulse 104, respirations 18, O2 sat 98% on room air. GENERAL:  No acute distress.  Mood and affect appropriate. MUSCULOSKELETAL:  His hand has decreased sensation median nerve distribution on the left side.  He has no signs of intrinsic atrophy in the hands.  In terms of shoulder positive impingement sign, he has normal strength in the upper extremities.  He has no pain with neck range of motion. Negative Spurling's.  IMPRESSION: 1. Median neuropathy wrist, left greater right.  The patient is     interested in surgery  now given the temporary effect that he gets     from the injections.  We will refer him to Dr. Shirlean Kelly for     this. 2. Right shoulder pain.  We will do subacromial bursa injection,     follow him up.  If still symptomatic may need a shoulder     ultrasound.  I discussed with patient, agrees with plan.     Erick Colace, M.D. Electronically Signed    AEK/MedQ D:  05/24/2011 13:00:14  T:  05/24/2011 21:30:34  Job #:  [id number]  cc:   Hewitt Shorts, M.D. Fax: [phone]

## 2011-06-21 ENCOUNTER — Encounter: Payer: Managed Care, Other (non HMO) | Attending: Neurosurgery | Admitting: Neurosurgery

## 2011-06-21 DIAGNOSIS — M79609 Pain in unspecified limb: Secondary | ICD-10-CM | POA: Insufficient documentation

## 2011-06-21 DIAGNOSIS — G5610 Other lesions of median nerve, unspecified upper limb: Secondary | ICD-10-CM | POA: Insufficient documentation

## 2011-06-21 DIAGNOSIS — G894 Chronic pain syndrome: Secondary | ICD-10-CM

## 2011-06-21 DIAGNOSIS — M25519 Pain in unspecified shoulder: Secondary | ICD-10-CM | POA: Insufficient documentation

## 2011-06-21 DIAGNOSIS — M76899 Other specified enthesopathies of unspecified lower limb, excluding foot: Secondary | ICD-10-CM

## 2011-06-21 NOTE — Assessment & Plan Note (Signed)
HISTORY OF PRESENT ILLNESS:  This is a patient of Dr. Wynn Banker seen for wrist and arm pain as well as some bilateral lower extremity pain.  He rates his average pain as 7-8.  It is a sharp, dull, stabbing, tingling, aching, constant pain.  He does not rate his activity level when the pain is worse.  All activities aggravate and TENS unit helps.  He states the medication such as the tramadol does not help all that much.  He does complain of left greater trochanter bursitis today and right shoulder pain.  He does use a cane to ambulate.  He can climb steps and drive.  He walks about 15 minutes at a time.  Functionally, he is on disability and needs help with meal prep and ADLs.  REVIEW OF SYSTEMS:  Notable for difficulties described above as well as some weight loss, low blood sugars, constipation, poor appetite, limb swelling, shortness of breath, bouts of narcolepsy, weakness, paresthesias, trouble walking, spasms, dizziness, confusion, depression, anxiety.  He has suicidal thoughts at times, was warned to seek help immediately if he has any problems with that and then escalate to a point where we may act on him.  PAST MEDICAL HISTORY/SOCIAL HISTORY/FAMILY HISTORY:  Unchanged.  PHYSICAL EXAMINATION:  VITAL SIGNS:  Blood pressure 141/70, pulse 106, respirations 16, O2 sats 99 on room air. NEUROLOGIC:  His motor strength and sensation are intact.  He has limited range of motion in his right upper extremity. CONSTITUTIONAL:  He is obese.  He is alert and oriented x3.  He has somewhat of a limp to his gait.  IMPRESSION: 1. 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     He was sent to Dr. Newell Coral for consideration of surgical     correction.  Dr. Newell Coral told him that he did not feel like he     needed any surgery right now.  He has been requesting a surgical     second opinion, so we are going to send him over to see Dr. Dairl Ponder when they can get  him in. 2. Right shoulder pain. 3. Left greater trochanter bursitis.  PLAN:  I have encouraged him to get back on his Mobic daily to try to help with the inflammation.  He did ask for another shoulder injection today.  I told him it was too soon after he just had one 30 days ago. We are going to inject the left greater trochanter for the bursitis. After informed consent, and alcohol and Betadine prep, I marked the area.  Informed the patient of the risks and benefits of the injection, so we will do the injection out from the room.  We injected him with 3 mL of lidocaine, 1 mL of Depo-Medrol 40 mg.  He tolerated it well. There was no bleeding, no blood draw back.  He knows to ice that area tonight.  Again, we will refer him over to Dr. Mina Marble regarding his wrist and arm pain.  He states he does not need any refills today.  His questions were encouraged and answered and we will see him back here in a month.     Jonerik Sliker L. Blima Dessert Electronically Signed    RLW/MedQ D:  06/21/2011 13:10:28  T:  06/21/2011 15:12:55  Job #:  [id number]

## 2011-07-19 ENCOUNTER — Encounter: Payer: Managed Care, Other (non HMO) | Attending: Physical Medicine & Rehabilitation

## 2011-07-19 ENCOUNTER — Encounter: Payer: Managed Care, Other (non HMO) | Admitting: Physical Medicine & Rehabilitation

## 2011-07-19 DIAGNOSIS — G5600 Carpal tunnel syndrome, unspecified upper limb: Secondary | ICD-10-CM | POA: Insufficient documentation

## 2011-07-19 NOTE — Assessment & Plan Note (Signed)
PROCEDURE:  Left subacromial bursa injection under ultrasound guidance.  INDICATION:  Prior non-guided injection resulted only 1-2 weeks' relief. Pain is only partially responsive to medication management and other conservative care, interferes with activities complaining at 6-7/10 pain.  Informed consent was obtained after describing risks and benefits of the procedure with the patient.  These include bleeding, bruising and infection.  He elects to proceed and has given written consent.  The patient placed in a supine position.  Area marked and prepped with Betadine and alcohol.  Under sterile conditions, a 22-gauge, 40-mm echo block needle was inserted under direct visualization of the long access. The subacromial bursa was identified.  Needle tip was guided into the subacromial bursa and a solution containing 1 mL of 40 mg/mL Depo-Medrol and 4 mL of 1% lidocaine were injected.  The patient tolerated the procedure well.  Postprocedure instructions were given.  He is to return in approximately 2 weeks' time.  If not better, then we will need to do a diagnostic ultrasound of the left shoulder region to include not only subacromial, but AC joint infraspinatus and supraspinatus.     Erick Colace, M.D. Electronically Signed    AEK/MedQ D:  07/19/2011 15:56:35  T:  07/19/2011 20:34:48  Job #:  [id number]

## 2011-08-11 ENCOUNTER — Ambulatory Visit: Payer: Managed Care, Other (non HMO) | Admitting: Physical Medicine & Rehabilitation

## 2011-08-11 DIAGNOSIS — G5600 Carpal tunnel syndrome, unspecified upper limb: Secondary | ICD-10-CM

## 2011-08-11 NOTE — Procedures (Signed)
NAMEBRANTON, EINSTEIN NO.:  [id number]  MEDICAL RECORD NO.:  [id number]           PATIENT TYPE:  O  LOCATION:  [facility]                          FACILITY:  [facility]  PHYSICIAN:  Erick Colace, M.D.DATE OF BIRTH:  09/23/1965  DATE OF PROCEDURE: DATE OF DISCHARGE:                              OPERATIVE REPORT  PROCEDURE:  Left carpal tunnel injection under ultrasound guidance.  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  He has had some short-term relief with carpal tunnel injection done without ultrasound guidance on January 27, 2011.  Informed consent was obtained after describing risks and benefits of the procedure with the patient.  These include bleeding, bruising, and infection.  He elects to proceed and has given written consent.  The patient placed in a seated position, area over the left wrist was pre- scanned, identified ulnar artery, ulnar nerve, median nerve.  Skin wheal was raised over the ulnar aspect of the distal wrist crease and a 25- gauge inch and half needle was inserted.  Doppler color ultrasound was used to identify the ulnar artery, needle successfully boarded.  Short axis views of the median nerve were obtained with in-plane visualization of the needle.  A 0.25 mL of Depo-Medrol plus 0.25 mL of lidocaine 1% were deposited on the volar aspect of the median nerve.  Then needle was positioned once again under direct ultrasound visualization to the dorsal aspect of the median nerve and same solution was injected.  The patient tolerated the procedure well.  Postprocedure instructions given.     Erick Colace, M.D. Electronically Signed    AEK/MEDQ  D:  08/11/2011 14:20:57  T:  08/11/2011 21:12:51  Job:  [id number]

## 2011-10-14 ENCOUNTER — Encounter: Payer: Self-pay | Admitting: Physical Medicine & Rehabilitation

## 2011-10-14 ENCOUNTER — Encounter: Payer: 59 | Attending: Physical Medicine & Rehabilitation

## 2011-10-14 ENCOUNTER — Ambulatory Visit (HOSPITAL_BASED_OUTPATIENT_CLINIC_OR_DEPARTMENT_OTHER): Payer: 59 | Admitting: Physical Medicine & Rehabilitation

## 2011-10-14 DIAGNOSIS — G5600 Carpal tunnel syndrome, unspecified upper limb: Secondary | ICD-10-CM | POA: Insufficient documentation

## 2011-10-14 DIAGNOSIS — IMO0001 Reserved for inherently not codable concepts without codable children: Secondary | ICD-10-CM

## 2011-10-14 DIAGNOSIS — M75100 Unspecified rotator cuff tear or rupture of unspecified shoulder, not specified as traumatic: Secondary | ICD-10-CM

## 2011-10-14 DIAGNOSIS — G5603 Carpal tunnel syndrome, bilateral upper limbs: Secondary | ICD-10-CM

## 2011-10-14 DIAGNOSIS — M7550 Bursitis of unspecified shoulder: Secondary | ICD-10-CM | POA: Insufficient documentation

## 2011-10-14 HISTORY — PX: CARPAL TUNNEL RELEASE: SHX101

## 2011-10-14 MED ORDER — MELOXICAM 15 MG PO TABS: 15.0000 mg | ORAL_TABLET | Freq: Every day | ORAL | Status: DC

## 2011-10-14 NOTE — Progress Notes (Signed)
Subjective:    Patient ID: Kevin Reed, male    DOB: 19-Jun-1966, 46 y.o.   MRN: [id number]  HPI She returns with multiple complaints today. Main complaint is problems with numbness in both hands. He does have carpal tunnel syndrome. He has been referred to hand surgery but states that his insurance now requires a very high deductible which he does not have. He is having problems buttoning buttons. He has another complaint of shoulder pain. He's had good results with subacromial bursa injections in the past. He feels like it is a similar location as it was before. He's had no falls no trauma. Review of systems is also negative for neck pain. He sees his psychiatrist who has him on benzodiazepines. Pain Inventory Average Pain 7 Pain Right Now 8 My pain is constant, dull and stabbing  In the last 24 hours, has pain interfered with the following? General activity 10 Relation with others 9 Enjoyment of life 10 What TIME of day is your pain at its worst? all of the time Sleep (in general) Poor  Pain is worse with: walking, bending, sitting and some activites Pain improves with: medication, TENS and injections Relief from Meds: 2  Mobility walk with assistance use a cane how many minutes can you walk? 15-20 ability to climb steps?  yes do you drive?  yes Do you have any goals in this area?  yes  Function disabled: date disabled 2010  Neuro/Psych weakness numbness tingling trouble walking spasms dizziness confusion depression anxiety  Prior Studies Any changes since last visit?  no  Physicians involved in your care Any changes since last visit?  yes Primary care [facility] Neurologist Dr Marylou Flesher (neurologist) Psychiatrist Barnett Abu      Review of Systems  Constitutional: Positive for diaphoresis and unexpected weight change.       Night sweats, hypoglycemia  HENT: Negative.   Eyes: Negative.   Respiratory: Positive for shortness of breath.     Cardiovascular: Negative.   Gastrointestinal: Positive for constipation.  Genitourinary: Positive for difficulty urinating.       Retention  Musculoskeletal: Positive for myalgias.  Skin: Positive for rash.       exema  Neurological: Positive for dizziness, weakness and numbness.  Hematological: Negative.   Psychiatric/Behavioral: Positive for sleep disturbance and dysphoric mood. The patient is nervous/anxious and is hyperactive.        ADHD       Objective:   Physical Exam  Constitutional: He is oriented to person, place, and time. He appears well-developed.       Obese  HENT:  Head: Normocephalic and atraumatic.  Neck: Normal range of motion.  Musculoskeletal:       Right shoulder: He exhibits decreased range of motion.       Left shoulder: He exhibits decreased range of motion.       Positive impingement sign at both shoulders  Neurological: He is alert and oriented to person, place, and time. He has normal strength. He displays no atrophy. No sensory deficit.  Reflex Scores:      Tricep reflexes are 2+ on the right side and 2+ on the left side.      Bicep reflexes are 2+ on the right side and 2+ on the left side.      Brachioradialis reflexes are 2+ on the right side and 2+ on the left side.      Patellar reflexes are 2+ on the right side and 2+ on the left  side.      Achilles reflexes are 2+ on the right side and 2+ on the left side.      Sensation to light touch is normal and equal in all 5 digits of both hands  Psychiatric: His speech is normal. Judgment and thought content normal. His affect is inappropriate. He is slowed and withdrawn. Cognition and memory are normal.          Assessment & Plan:  1. Carpal tunnel syndrome. I discussed that the longer he waits to do his wrist decompression surgery the more likely he will have permanent deficits. At this point he doesn't have any motor deficits only sensory. He thinks that in 2 months his financial situation should  improve and that he would pursue surgery at that time.  2. Bilateral subacromial bursitis he is starting on a nonsteroidal. Will increase the dose but also due injection of bilateral shoulders. 3. Anxiety and depression he will followup with psychiatry. 4. Narcolepsy followup with neurology he is on Adderall

## 2011-10-14 NOTE — Patient Instructions (Signed)
Joint Injection Care After Refer to this sheet in the next few days. These instructions provide you with information on caring for yourself after you have had a joint injection. Your caregiver also may give you more specific instructions. Your treatment has been planned according to current medical practices, but problems sometimes occur. Call your caregiver if you have any problems or questions after your procedure. After any type of joint injection, it is not uncommon to experience:  Soreness, swelling, or bruising around the injection site.   Mild numbness, tingling, or weakness around the injection site caused by the numbing medicine used before or with the injection.  It also is possible to experience the following effects associated with the specific agent after injection:  Iodine-based contrast agents:   Allergic reaction (itching, hives, widespread redness, and swelling beyond the injection site).   Corticosteroids (These effects are rare.):   Allergic reaction.   Increased blood sugar levels (If you have diabetes and you notice that your blood sugar levels have increased, notify your caregiver).   Increased blood pressure levels.   Mood swings.   Hyaluronic acid in the use of viscosupplementation.   Temporary heat or redness.   Temporary rash and itching.   Increased fluid accumulation in the injected joint.  These effects all should resolve within a day after your procedure.  HOME CARE INSTRUCTIONS  Limit yourself to light activity the day of your procedure. Avoid lifting heavy objects, bending, stooping, or twisting.   Take prescription or over-the-counter pain medication as directed by your caregiver.   You may apply ice to your injection site to reduce pain and swelling the day of your procedure. Ice may be applied 3 to 4 times:   Put ice in a plastic bag.   Place a towel between your skin and the bag.   Leave the ice on for no longer than 15 to 20 minutes  each time.  SEEK IMMEDIATE MEDICAL CARE IF:   Pain and swelling get worse rather than better or extend beyond the injection site.   Numbness does not go away.   Blood or fluid continues to leak from the injection site.   You have chest pain.   You have swelling of your face or tongue.   You have trouble breathing or you become dizzy.   You develop a fever, chills, or severe tenderness at the injection site that last longer than 1 day.  MAKE SURE YOU:  Understand these instructions.   Watch your condition.   Get help right away if you are not doing well or if you get worse.  Document Released: 04/14/2011 Document Reviewed: 03/16/2011 [employer] Patient Information 2012 [employer]. 

## 2011-11-04 ENCOUNTER — Ambulatory Visit: Payer: Managed Care, Other (non HMO) | Admitting: Physical Medicine & Rehabilitation

## 2011-11-15 ENCOUNTER — Other Ambulatory Visit: Payer: Self-pay | Admitting: Neurology

## 2011-11-15 DIAGNOSIS — R270 Ataxia, unspecified: Secondary | ICD-10-CM

## 2011-11-15 DIAGNOSIS — H532 Diplopia: Secondary | ICD-10-CM

## 2011-11-20 ENCOUNTER — Ambulatory Visit
Admission: RE | Admit: 2011-11-20 | Discharge: 2011-11-20 | Disposition: A | Discharge status: Home / Self Care | Payer: 59 | Source: Ambulatory Visit | Attending: Neurology | Admitting: Neurology

## 2011-11-20 DIAGNOSIS — H532 Diplopia: Secondary | ICD-10-CM

## 2011-11-20 DIAGNOSIS — R270 Ataxia, unspecified: Secondary | ICD-10-CM

## 2011-12-15 ENCOUNTER — Encounter: Payer: Self-pay | Admitting: Physical Medicine & Rehabilitation

## 2011-12-15 ENCOUNTER — Encounter: Payer: 59 | Attending: Physical Medicine & Rehabilitation

## 2011-12-15 ENCOUNTER — Ambulatory Visit (HOSPITAL_BASED_OUTPATIENT_CLINIC_OR_DEPARTMENT_OTHER): Payer: 59 | Admitting: Physical Medicine & Rehabilitation

## 2011-12-15 VITALS — BP 124/70 | HR 96 | Ht 70.0 in | Wt 268.0 lb

## 2011-12-15 DIAGNOSIS — G5603 Carpal tunnel syndrome, bilateral upper limbs: Secondary | ICD-10-CM

## 2011-12-15 DIAGNOSIS — G5600 Carpal tunnel syndrome, unspecified upper limb: Secondary | ICD-10-CM | POA: Insufficient documentation

## 2011-12-15 MED ORDER — TRAMADOL HCL 50 MG PO TABS: 100.0000 mg | ORAL_TABLET | Freq: Two times a day (BID) | ORAL | Status: DC | PRN

## 2011-12-15 NOTE — Progress Notes (Signed)
  Subjective:    Patient ID: Kevin Reed, male    DOB: 01/18/1966, 46 y.o.   MRN: [id number]  HPI Pain Inventory Average Pain 8 Pain Right Now 8 My pain is dull, tingling and aching  In the last 24 hours, has pain interfered with the following? General activity 10 Relation with others 7 Enjoyment of life 10 What TIME of day is your pain at its worst? all the time Sleep (in general) Fair  Pain is worse with: bending, sitting, inactivity and standing Pain improves with: medication and TENS Relief from Meds: 6  Mobility use a cane Do you have any goals in this area?  yes  Function not employed: date last employed   Neuro/Psych weakness numbness tingling trouble walking dizziness confusion depression anxiety  Prior Studies Any changes since last visit?  no  Physicians involved in your care Any changes since last visit?  no        Review of Systems  Constitutional: Positive for unexpected weight change.  HENT: Negative.   Eyes: Negative.   Respiratory: Negative.   Cardiovascular: Negative.   Gastrointestinal: Negative.   Genitourinary: Negative.   Musculoskeletal: Negative.   Skin: Negative.   Neurological: Positive for weakness and numbness.  Hematological: Negative.   Psychiatric/Behavioral: Positive for dysphoric mood.       Objective:   Physical Exam        Assessment & Plan:  Carpal tunnel injection Bilateral  Indication: Median neuropathy at the wrist documented by EMG or ultrasound and interfering with sleep and other functional activities. Symptoms are not relieved by conservative care.  Informed consent was obtained after describing risks and benefits of the procedure with the patient. These include bleeding bruising and infection as well as nerve injury. Patient elected to proceed and has given written consent. The distal wrist crease was marked and prepped with Betadine. A 30-gauge 1/2 inch needle was inserted and 0.25 ML's of 1%  lidocaine injected into the skin and subcutaneous tissue. Then a 27-gauge 1/2 inch needle was inserted into the carpal tunnel and 0.25 mL of depomedrol 40 mg per mL was injected. Patient tolerated procedure well. Post procedure instructions given. This procedure was done first on the right side than on the left

## 2011-12-15 NOTE — Patient Instructions (Addendum)
This injection can be repeated as soon as 3 months You can pick up a tramadol prescription at the [facility] on [address] and [address]

## 2011-12-19 ENCOUNTER — Telehealth: Payer: Self-pay

## 2011-12-19 NOTE — Telephone Encounter (Signed)
Pt called but did not leave detailed message as to why he was calling.

## 2011-12-19 NOTE — Telephone Encounter (Signed)
Pt called and did not leave message as to why he called.

## 2011-12-20 NOTE — Telephone Encounter (Signed)
Pt was calling about his referral to [facility]. I advised him that as soon as the girls up front get the appointment made they will call him and let him know. He understood.

## 2011-12-23 ENCOUNTER — Telehealth: Payer: Self-pay | Admitting: Physical Medicine & Rehabilitation

## 2011-12-23 NOTE — Telephone Encounter (Signed)
Lost details to MD at [facility].

## 2012-03-05 ENCOUNTER — Other Ambulatory Visit: Payer: Self-pay

## 2012-03-05 MED ORDER — TRAMADOL HCL 50 MG PO TABS: 100.0000 mg | ORAL_TABLET | Freq: Two times a day (BID) | ORAL | Status: DC | PRN

## 2012-03-16 ENCOUNTER — Ambulatory Visit: Payer: 59 | Admitting: Physical Medicine & Rehabilitation

## 2012-03-24 ENCOUNTER — Other Ambulatory Visit: Payer: Self-pay | Admitting: Physical Medicine & Rehabilitation

## 2012-04-11 ENCOUNTER — Other Ambulatory Visit: Payer: Self-pay | Admitting: Physical Medicine & Rehabilitation

## 2012-04-16 ENCOUNTER — Other Ambulatory Visit: Payer: Self-pay | Admitting: Physical Medicine & Rehabilitation

## 2012-07-09 ENCOUNTER — Encounter: Payer: 59 | Attending: Physical Medicine & Rehabilitation

## 2012-07-09 ENCOUNTER — Ambulatory Visit (HOSPITAL_BASED_OUTPATIENT_CLINIC_OR_DEPARTMENT_OTHER): Payer: 59 | Admitting: Physical Medicine & Rehabilitation

## 2012-07-09 ENCOUNTER — Encounter: Payer: Self-pay | Admitting: Physical Medicine & Rehabilitation

## 2012-07-09 VITALS — BP 141/83 | HR 104 | Resp 18 | Ht 70.0 in | Wt 269.4 lb

## 2012-07-09 DIAGNOSIS — G5603 Carpal tunnel syndrome, bilateral upper limbs: Secondary | ICD-10-CM

## 2012-07-09 DIAGNOSIS — M7550 Bursitis of unspecified shoulder: Secondary | ICD-10-CM

## 2012-07-09 DIAGNOSIS — IMO0001 Reserved for inherently not codable concepts without codable children: Secondary | ICD-10-CM

## 2012-07-09 DIAGNOSIS — G5600 Carpal tunnel syndrome, unspecified upper limb: Secondary | ICD-10-CM

## 2012-07-09 DIAGNOSIS — M7500 Adhesive capsulitis of unspecified shoulder: Secondary | ICD-10-CM | POA: Insufficient documentation

## 2012-07-09 DIAGNOSIS — M25819 Other specified joint disorders, unspecified shoulder: Secondary | ICD-10-CM | POA: Insufficient documentation

## 2012-07-09 DIAGNOSIS — Z598 Other problems related to housing and economic circumstances: Secondary | ICD-10-CM | POA: Insufficient documentation

## 2012-07-09 DIAGNOSIS — Z5987 Material hardship due to limited financial resources, not elsewhere classified: Secondary | ICD-10-CM | POA: Insufficient documentation

## 2012-07-09 DIAGNOSIS — M75100 Unspecified rotator cuff tear or rupture of unspecified shoulder, not specified as traumatic: Secondary | ICD-10-CM

## 2012-07-09 MED ORDER — NORTRIPTYLINE HCL 10 MG PO CAPS: 10.0000 mg | ORAL_CAPSULE | Freq: Every day | ORAL | Status: DC

## 2012-07-09 MED ORDER — TRAMADOL HCL 50 MG PO TABS
100.0000 mg | ORAL_TABLET | Freq: Two times a day (BID) | ORAL | Status: DC | PRN
Start: 2012-07-09 — End: 2012-07-10

## 2012-07-09 NOTE — Progress Notes (Signed)
  Subjective:    Patient ID: Kevin Reed, male    DOB: 1966-08-03, 46 y.o.   MRN: [id number] She returns with multiple complaints today. Main complaint is problems with numbness in both hands. He does have carpal tunnel syndrome. He has been referred to hand surgery but states that his insurance now requires a very high deductible which he does not have. He is having problems buttoning buttons. He has another complaint of shoulder pain. He's had good results with subacromial bursa injections in the past. He feels like it is a similar location as it was before. He's had no falls no trauma. Review of systems is also negative for neck pain. He sees his psychiatrist who has him on benzodiazepines. HPI Carpal tunnel surgery 5 /2013, Left side was performed. Because of poor response to not elect to have right side performed.   Review of Systems     Objective:   Physical Exam  Constitutional: He is oriented to person, place, and time. He appears well-developed and well-nourished.  HENT:  Head: Normocephalic and atraumatic.  Eyes: Conjunctivae normal and EOM are normal. Pupils are equal, round, and reactive to light.  Neck: Normal range of motion.  Musculoskeletal:       Right shoulder: He exhibits decreased range of motion and pain. He exhibits no tenderness.       Left shoulder: He exhibits decreased range of motion and pain. He exhibits no tenderness.       Positive impingement sign bilateral shoulders Pain with end range abduction, external rotation,adduction  Neurological: He is alert and oriented to person, place, and time. He has normal strength. He displays atrophy. A sensory deficit is present. Gait normal.       B APB atrophy Fine tremor L>R hand          Assessment & Plan:  1. Bilateral subacromial impingement syndrome, pain is keeping him up at night already on nonsteroidals. Will inject today 2. 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 Fibromyalgia syndrome continue tramadol as well as nortriptyline 4. Carpal tunnel syndrome chronic median neuropathy, did not improve after surgery because of long duration of preoperative compression   Bilateral subacromial bursa injection Subacromial impingement syndrome Pain is not responsive to nonsteroidal anti-inflammatories and range of motion Informed consent was obtained after describing risks and benefits of the procedure with the patient these include bleeding bruising and infection he next proceed and has given written consent Patient placed in a seated position area of the lateral subacromial area was marked and prepped with Betadine and alcohol and entered with a 25-gauge 1.5 inch needle 1/2 cc of 40 mg Depo-Medrol and 4 cc lidocaine 1% were injected first on the right than on the left. Patient tolerated procedure well Postprocedure instructions given

## 2012-07-09 NOTE — Progress Notes (Signed)
  Subjective:    Patient ID: Kevin Reed, male    DOB: 1966-04-01, 46 y.o.   MRN: [id number]  HPI   Pain Inventory Average Pain 8 Pain Right Now 8 My pain is constant, sharp and burning  In the last 24 hours, has pain interfered with the following? General activity 9 Relation with others 9 Enjoyment of life 10 What TIME of day is your pain at its worst? morning and evening Sleep (in general) Poor  Pain is worse with: unsure Pain improves with: rest, medication, TENS and injections Relief from Meds: 7  Mobility walk without assistance use a cane how many minutes can you walk? 5 ability to climb steps?  yes do you drive?  yes Do you have any goals in this area?  yes  Function disabled: date disabled unsure  Neuro/Psych weakness numbness tremor tingling trouble walking spasms dizziness confusion depression anxiety  Prior Studies Any changes since last visit?  no  Physicians involved in your care Any changes since last visit?  no   Family History  Problem Relation Age of Onset  . Cancer Father 39    colon and bladder cancer   History   Social History  . Marital Status: Married    Spouse Name: N/A    Number of Children: N/A  . Years of Education: N/A   Social History Main Topics  . Smoking status: Current Every Day Smoker -- 0.3 packs/day for 30 years    Types: Cigarettes  . Smokeless tobacco: Never Used  . Alcohol Use: None  . Drug Use: None  . Sexually Active: None   Other Topics Concern  . None   Social History Narrative  . None   Past Surgical History  Procedure Date  . Gastric bypass 2001  . Carpal tunnel release    Past Medical History  Diagnosis Date  . Allergy   . Anemia   . Anxiety   . Depression   . Neuromuscular disorder    BP 141/83  Pulse 104  Resp 18  Ht 5\' 10"  (1.778 m)  Wt 269 lb 6.4 oz (122.199 kg)  BMI 38.65 kg/m2  SpO2 99%  Review of Systems  Constitutional: Positive for appetite change.    Respiratory: Positive for shortness of breath.   Cardiovascular: Positive for leg swelling.  Gastrointestinal: Positive for nausea, vomiting, abdominal pain and diarrhea.  Genitourinary: Positive for difficulty urinating.  Musculoskeletal: Positive for myalgias, arthralgias and gait problem.  Neurological: Positive for dizziness, tremors, weakness and numbness.  Psychiatric/Behavioral: Positive for confusion and dysphoric mood. The patient is nervous/anxious.   All other systems reviewed and are negative.       Objective:   Physical Exam        Assessment & Plan:

## 2012-07-10 ENCOUNTER — Telehealth: Payer: Self-pay

## 2012-07-10 MED ORDER — NORTRIPTYLINE HCL 10 MG PO CAPS: 10.0000 mg | ORAL_CAPSULE | Freq: Every day | ORAL | Status: DC

## 2012-07-10 MED ORDER — TRAMADOL HCL 50 MG PO TABS: 100.0000 mg | ORAL_TABLET | Freq: Two times a day (BID) | ORAL | Status: DC | PRN

## 2012-07-10 NOTE — Telephone Encounter (Signed)
Tramadol and nortriptyline were sent into [facility].  They originally got sent to mail order.  Patient request we send to [facility].

## 2012-07-10 NOTE — Telephone Encounter (Signed)
Patient called regarding refills on medications that he thought had been sent to the pharmacy.  He uses [facility] on [address].  He thinks its supposed to be tramadol, meloxicam and some type of sleeping pill.

## 2012-08-13 ENCOUNTER — Other Ambulatory Visit: Payer: Self-pay | Admitting: Neurology

## 2012-08-13 DIAGNOSIS — H532 Diplopia: Secondary | ICD-10-CM

## 2012-08-13 DIAGNOSIS — R2681 Unsteadiness on feet: Secondary | ICD-10-CM

## 2012-08-17 ENCOUNTER — Ambulatory Visit
Admission: RE | Admit: 2012-08-17 | Discharge: 2012-08-17 | Disposition: A | Discharge status: Home / Self Care | Payer: 59 | Source: Ambulatory Visit | Attending: Neurology | Admitting: Neurology

## 2012-08-17 DIAGNOSIS — H532 Diplopia: Secondary | ICD-10-CM

## 2012-08-17 DIAGNOSIS — R2681 Unsteadiness on feet: Secondary | ICD-10-CM

## 2012-08-17 MED ORDER — GADOBENATE DIMEGLUMINE 529 MG/ML IV SOLN
20.0000 mL | Freq: Once | INTRAVENOUS | Status: AC | PRN
  Administered 2012-08-17: 20 mL via INTRAVENOUS

## 2012-09-22 ENCOUNTER — Encounter (HOSPITAL_COMMUNITY): Payer: Self-pay | Admitting: Emergency Medicine

## 2012-09-22 ENCOUNTER — Emergency Department (HOSPITAL_COMMUNITY): Payer: 59

## 2012-09-22 ENCOUNTER — Emergency Department (HOSPITAL_COMMUNITY)
Admission: EM | Admit: 2012-09-22 | Discharge: 2012-09-25 | Disposition: A | Discharge status: Other Acute Inpatient Hospital | Payer: 59 | Attending: Emergency Medicine | Admitting: Emergency Medicine

## 2012-09-22 DIAGNOSIS — F323 Major depressive disorder, single episode, severe with psychotic features: Secondary | ICD-10-CM | POA: Insufficient documentation

## 2012-09-22 DIAGNOSIS — G709 Myoneural disorder, unspecified: Secondary | ICD-10-CM | POA: Insufficient documentation

## 2012-09-22 DIAGNOSIS — F411 Generalized anxiety disorder: Secondary | ICD-10-CM | POA: Insufficient documentation

## 2012-09-22 DIAGNOSIS — R4182 Altered mental status, unspecified: Secondary | ICD-10-CM | POA: Insufficient documentation

## 2012-09-22 DIAGNOSIS — D649 Anemia, unspecified: Secondary | ICD-10-CM | POA: Insufficient documentation

## 2012-09-22 DIAGNOSIS — Z79899 Other long term (current) drug therapy: Secondary | ICD-10-CM | POA: Insufficient documentation

## 2012-09-22 DIAGNOSIS — F17200 Nicotine dependence, unspecified, uncomplicated: Secondary | ICD-10-CM | POA: Insufficient documentation

## 2012-09-22 DIAGNOSIS — F29 Unspecified psychosis not due to a substance or known physiological condition: Secondary | ICD-10-CM

## 2012-09-22 DIAGNOSIS — F339 Major depressive disorder, recurrent, unspecified: Secondary | ICD-10-CM

## 2012-09-22 LAB — CBC
HCT: 39.2 % (ref 39.0–52.0)
Hemoglobin: 12 g/dL — ABNORMAL LOW (ref 13.0–17.0)
MCH: 23.3 pg — ABNORMAL LOW (ref 26.0–34.0)
MCV: 76.1 fL — ABNORMAL LOW (ref 78.0–100.0)
Platelets: 319 10*3/uL (ref 150–400)
RBC: 5.15 MIL/uL (ref 4.22–5.81)
RDW: 17.7 % — ABNORMAL HIGH (ref 11.5–15.5)
WBC: 5.7 10*3/uL (ref 4.0–10.5)

## 2012-09-22 LAB — COMPREHENSIVE METABOLIC PANEL
BUN: 9 mg/dL (ref 6–23)
CO2: 24 mEq/L (ref 19–32)
Calcium: 8.9 mg/dL (ref 8.4–10.5)
Chloride: 103 mEq/L (ref 96–112)
GFR calc non Af Amer: >90 mL/min (ref >90)
Total Bilirubin: 0.2 mg/dL — ABNORMAL LOW (ref 0.3–1.2)

## 2012-09-22 LAB — RAPID URINE DRUG SCREEN, HOSP PERFORMED
Amphetamines: POSITIVE — AB
Tetrahydrocannabinol: NOT DETECTED

## 2012-09-22 LAB — SALICYLATE LEVEL: Salicylate Lvl: 2 mg/dL — ABNORMAL LOW (ref 2.8–20.0)
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
## 2012-09-23 MED ORDER — DIAZEPAM 5 MG PO TABS
10.0000 mg | ORAL_TABLET | Freq: Four times a day (QID) | ORAL | Status: DC | PRN
  Administered 2012-09-23 – 2012-09-25 (×4): 10 mg via ORAL
  Filled 2012-09-23 (×4): qty 2

## 2012-09-23 MED ORDER — IBUPROFEN 200 MG PO TABS
600.0000 mg | ORAL_TABLET | Freq: Three times a day (TID) | ORAL | Status: DC | PRN
  Administered 2012-09-23 – 2012-09-25 (×3): 600 mg via ORAL
  Filled 2012-09-23 (×2): qty 3
  Filled 2012-09-23: qty 1
  Filled 2012-09-23: qty 2

## 2012-09-23 MED ORDER — ONDANSETRON HCL 4 MG PO TABS: 4.0000 mg | ORAL_TABLET | Freq: Three times a day (TID) | ORAL | Status: DC | PRN

## 2012-09-23 MED ORDER — ALUM & MAG HYDROXIDE-SIMETH 200-200-20 MG/5ML PO SUSP: 30.0000 mL | ORAL | Status: DC | PRN

## 2012-09-23 MED ORDER — LORAZEPAM 1 MG PO TABS
1.0000 mg | ORAL_TABLET | Freq: Three times a day (TID) | ORAL | Status: DC | PRN
  Administered 2012-09-23: 1 mg via ORAL
  Filled 2012-09-23: qty 1

## 2012-09-23 MED ORDER — ACETAMINOPHEN 325 MG PO TABS
650.0000 mg | ORAL_TABLET | ORAL | Status: DC | PRN
  Administered 2012-09-23 – 2012-09-24 (×3): 650 mg via ORAL
  Filled 2012-09-23 (×4): qty 2

## 2012-09-23 NOTE — ED Notes (Signed)
Pt coming up to nurses station, wanting to leave.  I called pt's mobile crisis counselor who stated that she had IVC paperwork already filled out in the chart.  Upon inspection of paperwork, it was not filled out correctly.  Dr. Freida Busman notified.  Counselor going to [employer] office to complete paperwork.

## 2012-09-23 NOTE — ED Provider Notes (Signed)
  Medical screening examination/treatment/procedure(s) were performed by non-physician practitioner and as supervising physician I was immediately available for consultation/collaboration.    Gerhard Munch, MD 09/23/12 2121

## 2012-09-23 NOTE — ED Notes (Signed)
Pt is sleeping soundly.

## 2012-09-23 NOTE — ED Notes (Signed)
[facility] made aware of pt.

## 2012-09-23 NOTE — B[facility] Counselor (Signed)
TC to [facility] at [employer]. Per chart review, Gaylyn Rong Pappas-McFayden of [facility] conducted pt assessment and copy is in pt's chart. Writer asked if Gaylyn Rong would be following up with bed finding as per [facility]'s usual procedure. Mardelle Matte told Clinical research associate that Mardelle Matte would call Gaylyn Rong and make sure Gaylyn Rong was indeed working on bed finding.   As [facility] Mobile Crisis has assessed person, [facility] will no be following patient.
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
## 2012-09-24 ENCOUNTER — Other Ambulatory Visit: Payer: Self-pay

## 2012-09-24 LAB — URINALYSIS, ROUTINE W REFLEX MICROSCOPIC
Glucose, UA: NEGATIVE mg/dL
Hgb urine dipstick: NEGATIVE
Ketones, ur: 15 mg/dL — AB
Leukocytes, UA: NEGATIVE
Nitrite: NEGATIVE
Protein, ur: NEGATIVE mg/dL
Specific Gravity, Urine: 1.03 (ref 1.005–1.030)
Urobilinogen, UA: 0.2 mg/dL (ref 0.0–1.0)
pH: 5.5 (ref 5.0–8.0)

## 2012-09-24 LAB — TSH: TSH: 0.212 u[IU]/mL — ABNORMAL LOW (ref 0.350–4.500)

## 2012-09-24 NOTE — BHH Counselor (Signed)
TC from Amor at [facility]. Pt's referral will be reviewed by their MD and they'll call back.  Evette Cristal, [place] Assessment Counselor

## 2012-09-24 NOTE — Progress Notes (Signed)
Patient ID: Kevin Reed, male   DOB: 10-Jun-1966, 47 y.o.   MRN: [id number]   Patient was sound sleep and difficult to wake up with a verbal stimuli. Staff reported that he has given medication for muscle spasms. Patient was initially taken Tylenol, which was not helpful so he was given Valium 10 mg as prescribed for as needed. Psychiatric evaluation was not completed at this time.

## 2012-09-24 NOTE — ED Notes (Signed)
Pt ambulated to bathroom via cane and one assist. Pt restless, complaining about back pain. Pt cooperative.

## 2012-09-24 NOTE — Progress Notes (Signed)
Will wait to draw lab when pt awake.  Only lab ordered is TSH and it will not be run here and therefore will not be sent out until 0830.  Barnett Hatter P

## 2012-09-24 NOTE — BHH Counselor (Signed)
Pt brought in by mobile crisis 2/8. Pt has been assessed by mobile crisis. They are following up with "bed finding" for pt.  [facility] not following pt. During shift report  2/10 Idalia Needle stated that patient was referred to [facility]  - Amor from [facility] verified information sent. Pt is pending accepting to the [facility] wait list.  **[facility] Meliton Rattan called and stated that she would come in today and work on bed finding for this patient. Upon her arrival she stated that she was called to see another patient out in the community and would not be able to stay and work on patient at this time. Maxine Glenn said that another staff member would come to [facility] and work on patients disposition after 1pm today.

## 2012-09-24 NOTE — ED Notes (Addendum)
Juliette Alcide RN states patient sleeping. RN request phlebotomy to defer lab draw to the am.

## 2012-09-24 NOTE — BHH Counselor (Signed)
[facility] contacted to inquire about when staff would be available to work on patients disposition. Per Jacki Cones, a staff member is on the way and will be here to work on patients disposition.

## 2012-09-25 ENCOUNTER — Inpatient Hospital Stay (HOSPITAL_COMMUNITY)
Admission: AD | Admit: 2012-09-25 | Discharge: 2012-09-28 | DRG: 881 | Disposition: A | Discharge status: Home / Self Care | Payer: Federal, State, Local not specified - Other | Source: Intra-hospital | Attending: Psychiatry | Admitting: Psychiatry

## 2012-09-25 DIAGNOSIS — Z79899 Other long term (current) drug therapy: Secondary | ICD-10-CM

## 2012-09-25 DIAGNOSIS — G609 Hereditary and idiopathic neuropathy, unspecified: Secondary | ICD-10-CM | POA: Diagnosis present

## 2012-09-25 DIAGNOSIS — F339 Major depressive disorder, recurrent, unspecified: Secondary | ICD-10-CM

## 2012-09-25 DIAGNOSIS — F329 Major depressive disorder, single episode, unspecified: Principal | ICD-10-CM | POA: Diagnosis present

## 2012-09-25 DIAGNOSIS — F3289 Other specified depressive episodes: Principal | ICD-10-CM | POA: Diagnosis present

## 2012-09-25 DIAGNOSIS — F411 Generalized anxiety disorder: Secondary | ICD-10-CM | POA: Diagnosis present

## 2012-09-25 MED ORDER — NICOTINE 21 MG/24HR TD PT24
21.0000 mg | MEDICATED_PATCH | Freq: Every day | TRANSDERMAL | Status: DC
  Filled 2012-09-25: qty 1

## 2012-09-25 MED ORDER — DIAZEPAM 5 MG PO TABS
10.0000 mg | ORAL_TABLET | Freq: Every day | ORAL | Status: DC
  Administered 2012-09-25 – 2012-09-27 (×3): 10 mg via ORAL
  Filled 2012-09-25 (×3): qty 2

## 2012-09-25 MED ORDER — ACETAMINOPHEN 325 MG PO TABS
650.0000 mg | ORAL_TABLET | Freq: Four times a day (QID) | ORAL | Status: DC | PRN
  Administered 2012-09-26 – 2012-09-28 (×5): 650 mg via ORAL

## 2012-09-25 MED ORDER — NICOTINE 14 MG/24HR TD PT24
14.0000 mg | MEDICATED_PATCH | Freq: Every day | TRANSDERMAL | Status: DC
  Administered 2012-09-26 – 2012-09-28 (×3): 14 mg via TRANSDERMAL
  Filled 2012-09-25 (×6): qty 1

## 2012-09-25 MED ORDER — AMPHETAMINE-DEXTROAMPHET ER 20 MG PO CP24
30.0000 mg | ORAL_CAPSULE | ORAL | Status: DC
  Administered 2012-09-26 – 2012-09-27 (×2): 30 mg via ORAL
  Administered 2012-09-28: 07:00:00 via ORAL
  Filled 2012-09-25 (×2): qty 1
  Filled 2012-09-25: qty 3

## 2012-09-25 MED ORDER — METHOCARBAMOL 500 MG PO TABS: 500.0000 mg | ORAL_TABLET | Freq: Two times a day (BID) | ORAL | Status: DC | PRN

## 2012-09-25 MED ORDER — MAGNESIUM HYDROXIDE 400 MG/5ML PO SUSP: 30.0000 mL | Freq: Every day | ORAL | Status: DC | PRN

## 2012-09-25 MED ORDER — TRAMADOL HCL 50 MG PO TABS
50.0000 mg | ORAL_TABLET | Freq: Two times a day (BID) | ORAL | Status: DC
  Administered 2012-09-25 – 2012-09-27 (×5): 50 mg via ORAL
  Filled 2012-09-25 (×12): qty 1

## 2012-09-25 MED ORDER — ALUM & MAG HYDROXIDE-SIMETH 200-200-20 MG/5ML PO SUSP: 30.0000 mL | ORAL | Status: DC | PRN

## 2012-09-25 NOTE — ED Notes (Signed)
While talking to pt he stated that he missed his son his wife took him away and became tearful. He stated that she took him away this all prompt without interviewing
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
## 2012-09-25 NOTE — ED Notes (Signed)
MD at bedside. 

## 2012-09-25 NOTE — Consult Note (Signed)
Reason for Consult: Depression and safety disorganized behaviors and mumbling speech possibly intention overdose of medication Referring Physician: Dr. [name] is an 47 y.o. male.  HPI: Patient was seen and chart reviewed. Patient was came to the [facility] emergency department via [facility] crisis team from [facility]. Reportedly patient family called with the severe constant self change in mental status with disease.denies behaviors and random speech and thoughts. Patient seems like overdose intentionally his medication Adderall Valium and pain medications like some are done on Robaxin. Patient has been talking about his ex-wife has been a busy to him and that his medications are not working for him. Patient to make a statement his ex-wife he is a social pack to put this is 65 years old son from him and living in [place]. Patient made statements he has a counselor appointment. Patient also having hot time steady on his feet without cane. Patient does not do specific reasons why he is losing his balance but stated he has been losing balance since 4 2004 may have fallen down from stairs at that time. The view of his severed using that screen showed positive for benzodiazepines and amphetamines he also has a low thyroid-stimulating hormone. Patient made statements that he is trying to help reviewed that this and we'll just him and counseled him without association with specific group or organization.  MSE: Patient was anxious depressed and able to participate in interview. Patient states her mood was depressed and anxious his affect was appropriate on constricted he has been a poor historian but he has normal speech but tapered off quickly his mood was unstable  Past Medical History  Diagnosis Date  . Allergy   . Anemia   . Anxiety   . Depression   . Neuromuscular disorder     Past Surgical History  Procedure Laterality Date  . Gastric bypass  2001  . Carpal tunnel release       Family History  Problem Relation Age of Onset  . Cancer Father 33    colon and bladder cancer    Social History:  reports that he has been smoking Cigarettes.  He has a 9 pack-year smoking history. He has never used smokeless tobacco. He reports that he does not drink alcohol or use illicit drugs.  Allergies:  Allergies  Allergen Reactions  . Lamictal (Lamotrigine) Rash    Medications: I have reviewed the patient's current medications.  Results for orders placed during the hospital encounter of 09/22/12 (from the past 48 hour(s))  URINALYSIS, ROUTINE W REFLEX MICROSCOPIC     Status: Abnormal   Collection Time    09/24/12  6:15 AM      Result Value Range   Color, Urine AMBER (*) YELLOW   Comment: BIOCHEMICALS MAY BE AFFECTED BY COLOR   APPearance CLEAR  CLEAR   Specific Gravity, Urine 1.030  1.005 - 1.030   pH 5.5  5.0 - 8.0   Glucose, UA NEGATIVE  NEGATIVE mg/dL   Hgb urine dipstick NEGATIVE  NEGATIVE   Bilirubin Urine SMALL (*) NEGATIVE   Ketones, ur 15 (*) NEGATIVE mg/dL   Protein, ur NEGATIVE  NEGATIVE mg/dL   Urobilinogen, UA 0.2  0.0 - 1.0 mg/dL   Nitrite NEGATIVE  NEGATIVE   Leukocytes, UA NEGATIVE  NEGATIVE   Comment: MICROSCOPIC NOT DONE ON URINES WITH NEGATIVE PROTEIN, BLOOD, LEUKOCYTES, NITRITE, OR GLUCOSE <1000 mg/dL.  TSH     Status: Abnormal   Collection Time  09/24/12  7:03 AM      Result Value Range   TSH 0.212 (*) 0.350 - 4.500 uIU/mL    No results found.  Positive for anxiety, bad mood, depression, mood swings and sleep disturbance Blood pressure 128/88, pulse 98, temperature 98.6 F (37 C), temperature source Oral, resp. rate 16, SpO2 98.00%.   Assessment/Plan: Major depressive disorder, recurrent Anxiety disorder not of this despite Narcolepsy by history  Recommended in acute psychiatric hospitalization for crisis stabilization, Secure therapeutic milieu and medication management and patient can be discharged to outpatient [facility]  psychiatric services when he was stable.   Kevin Reed,Kevin R. 09/25/2012, 5:30 PM

## 2012-09-25 NOTE — ED Notes (Signed)
Received a call from mobile crisis-[phone] Thayer Ohm) and was questioning if patient had a bed at [facility]. No order for transfer to [facility] will reassess and return call.

## 2012-09-25 NOTE — BHH Counselor (Signed)
Kelly from mobile crises called this morning stating that she would send Mardelle Matte out to the ED today to continue seeking bed placement for this patient. Patient continues to remain in the ED pending bed placement.

## 2012-09-25 NOTE — BHH Counselor (Signed)
This Clinical research associate has been notified that pt has been accepted for treatment with [facility], attending will Patrick North, MD, [phone].  This Clinical research associate contacted TA to return to [facility] to complete disposition with pt.

## 2012-09-26 DIAGNOSIS — G609 Hereditary and idiopathic neuropathy, unspecified: Secondary | ICD-10-CM

## 2012-09-26 NOTE — BHH Suicide Risk Assessment (Signed)
Suicide Risk Assessment  Admission Assessment     Nursing information obtained from:  Patient;Review of record Demographic factors:  Male;Divorced or widowed;Caucasian;Low socioeconomic status;Unemployed Current Mental Status:  Suicidal ideation indicated by others;Suicide plan Loss Factors:  Loss of significant relationship;Financial problems / change in socioeconomic status Historical Factors:  Prior suicide attempts;Family history of mental illness or substance abuse Risk Reduction Factors:  Responsible for children under 45 years of age;Sense of responsibility to family;Religious beliefs about death;Positive social support  CLINICAL FACTORS:   Depression:   Anhedonia Hopelessness Impulsivity Insomnia Severe  COGNITIVE FEATURES THAT CONTRIBUTE TO RISK:  Closed-mindedness Loss of executive function    SUICIDE RISK:   Mild:  Suicidal ideation of limited frequency, intensity, duration, and specificity.  There are no identifiable plans, no associated intent, mild dysphoria and related symptoms, good self-control (both objective and subjective assessment), few other risk factors, and identifiable protective factors, including available and accessible social support.  PLAN OF CARE: Adjust medications as needed. Encourage patient to attend groups. Obtain collateral information.  I certify that inpatient services furnished can reasonably be expected to improve the patient's condition.  Jentry Mcqueary 09/26/2012, 11:39 AM

## 2012-09-26 NOTE — Progress Notes (Signed)
BHH LCSW Group Therapy  09/26/2012 1:58 PM  Type of Therapy:  Group Therapy  Participation Level:  Minimal  Participation Quality:  Appropriate  Affect:  Anxious, Depressed and Flat  Cognitive:  Appropriate  Insight:  Developing/Improving  Engagement in Therapy:  Developing/Improving  Modes of Intervention:  Discussion, Exploration, Problem-solving and Support  Summary of Progress/Problems:  Patient shared he becomes upset when someone disrespects his belongings.  He shared he tends to speak out in a inappropriate manner.  Patient encouraged to speak up in a way that does not end relationships.  Wynn Banker 09/26/2012, 1:58 PM

## 2012-09-26 NOTE — Progress Notes (Signed)
BHH Group Notes:  (Counselor/Nursing/MHT/Case Management/Adjunct)  Type of Therapy:  Psychoeducational Skills  Participation Level:  Active  Participation Quality:  Appropriate, Attentive and Gaurded  Affect:  Depressed and Tearful  Cognitive:  Appropriate and Oriented  Insight:  Good  Engagement in Group:  Engaged  Modes of Intervention:  Activity, Discussion, Education, Problem-solving, Rapport Building, Socialization and Support  Summary of Progress/Problems: Marquarius attended psychoeducational group that focused on using quality time with support systems/individuals to engage in health coping skills. Munir participated in activity guessing about self and peers. Vester was attentive and became tearful while group discussed who their support systems are, how they can spend positive quality time with them as a coping skills and a way to strengthen their relationship. Albaro stated support systems give him someone to love and someone to live for but did not express what was causing him to tear up. Group discussed [facility] and Vondell was given information on their meeting schedule. Kyden was given a homework assignment to find two ways to improve his support systems and twenty activities he can do to spend quality time with his supports.   Wandra Scot 09/26/2012 3:00 PM
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
## 2012-09-26 NOTE — Tx Team (Signed)
Interdisciplinary Treatment Plan Update (Adult)  Date:  09/26/2012  Time Reviewed:  10:27 AM   Progress in Treatment: Attending groups:   Yes   Participating in groups:  Yes Taking medication as prescribed:  Yes Tolerating medication:  Yes Family/Significant othe contact made: Contact made with family Patient understands diagnosis:  Yes Discussing patient identified problems/goals with staff: Yes Medical problems stabilized or resolved: Yes Denies suicidal/homicidal ideation:Yes Issues/concerns per patient self-inventory:  Other:   New problem(s) identified:  Reason for Continuation of Hospitalization: Anxiety Depression Medication stabilization Suicidal ideation  Interventions implemented related to continuation of hospitalization:  Medication Management; safety checks q 15 mins  Additional comments:  Estimated length of stay:  2-3 days  Discharge Plan:  Home with outpatient follow up  New goal(s):  Review of initial/current patient goals per problem list:    1.  Goal(s): Eliminate SI/other thoughts of self harm (Patient will no longer endorse SI/HI or thoughts of self harm)   Met:  Yes  Target date: d/c  As evidenced by: Patient no longer endorses SI/HI or other thoughts of self harm.    2.  Goal (s):Reduce depression/anxiety (Paitent will rate symptoms at four or below)  Met: Yes  Target date: d/c  As evidenced by: Patient rates symptoms at one and three    3.  Goal(s):.stabilize on meds (Patient will report being stabilized on medications - less symptomatic)   Met:  No  Target date: d/c  As evidenced by: MD to evaluate patient for medications    4.  Goal(s): Refer for outpatient follow up (Follow up appointment will be scheduled)   Met:  Yes  Target date: d/c  As evidenced by: Follow up appointment scheduled    Attendees: Patient:  Kevin Reed 09/26/2012 10:27 AM  Physican:  Patrick North, MD 09/26/2012 10:27 AM  Nursing:     Harold Barban, RN 09/26/2012 10:27 AM   Nursing:    Leighton Parody, RN 09/26/2012 10:27 AM   Clinical Social Worker:  Juline Patch, LCSW 09/26/2012 10:27 AM   Other: Tera Helper, PHM-NP 09/26/2012 10:27 AM   Other:   09/26/2012 10:27 AM Other:        09/26/2012 10:27 AM
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
## 2012-09-26 NOTE — Progress Notes (Signed)
[facility] INPATIENT:  Family/Significant Other Suicide Prevention Education  Suicide Prevention Education:  Education Completed; Marco Raper, [phone]) has been identified by the patient as the family member/significant other with whom the patient will be residing, and identified as the person(s) who will aid the patient in the event of a mental health crisis (suicidal ideations/suicide attempt).  With written consent from the patient, the family member/significant other has been provided the following suicide prevention education, prior to the and/or following the discharge of the patient.  The suicide prevention education provided includes the following:  Suicide risk factors  Suicide prevention and interventions  National Suicide Hotline telephone number  [facility] assessment telephone number  [facility] Emergency Assistance 911  [facility] and/or Residential Mobile Crisis Unit telephone number  Request made of family/significant other to:  Remove weapons (e.g., guns, rifles, knives), all items previously/currently identified as safety concern.  Mother report there are guns in the home.  Remove drugs/medications (over-the-counter, prescriptions, illicit drugs), all items previously/currently identified as a safety concern.  The family member/significant other verbalizes understanding of the suicide prevention education information provided.  The family member/significant other agrees to remove the items of safety concern listed above.  Wynn Banker 09/26/2012, 9:39 AM

## 2012-09-26 NOTE — Consult Note (Signed)
Reason for Consult:AMS, ataxic gait Referring Physician: Daleen Bo  CC: Difficulty with gait  HPI: Kevin Reed is an 47 y.o. male admitted for depression.  Reports that he does not feel that he has any confusion.  Is able to give a full and cogent history.  Reports that he has had difficulty with gait since 2012 when he began falling.  Was seen at multiple tertiary care centers and work ups were initiated.  Eventually he was diagnosed with a peripheral neuropathy.  Etiology was unclear.  Patient did eventually have hair analysis and a toxin was identified bu was unable to be treated.  Patient has ad some progression but is still able to function.  He reports numbness in his extremities but no weakness or bowel/bladder incontinence.  Also reports that he has diplopia for which he has seen an ophthalmologist.  MRI's have been performed with no etiology found.  He has been prescribed prisms which seem to help.    Past Medical History  Diagnosis Date  . Allergy   . Anemia   . Anxiety   . Depression   . Neuromuscular disorder     Past Surgical History  Procedure Laterality Date  . Gastric bypass  2001  . Carpal tunnel release      Family History  Problem Relation Age of Onset  . Cancer Father 10    colon and bladder cancer    Social History:  reports that he has been smoking Cigarettes.  He has a 9 pack-year smoking history. He has never used smokeless tobacco. He reports that he does not drink alcohol or use illicit drugs.  Allergies  Allergen Reactions  . Lamictal (Lamotrigine) Rash    Medications:  I have reviewed the patient's current medications. Scheduled: . amphetamine-dextroamphetamine  30 mg Oral BH-q7a  . diazepam  10 mg Oral QHS  . nicotine  14 mg Transdermal Daily  . traMADol  50 mg Oral Q12H    ROS: History obtained from the patient  General ROS: negative for - chills, fatigue, fever, night sweats, weight gain or weight loss Psychological ROS: suicidal  ideation Ophthalmic ROS: double vision ENT ROS: negative for - epistaxis, nasal discharge, oral lesions, sore throat, tinnitus or vertigo Allergy and Immunology ROS: negative for - hives or itchy/watery eyes Hematological and Lymphatic ROS: negative for - bleeding problems, bruising or swollen lymph nodes Endocrine ROS: negative for - galactorrhea, hair pattern changes, polydipsia/polyuria or temperature intolerance Respiratory ROS: negative for - cough, hemoptysis, shortness of breath or wheezing Cardiovascular ROS: negative for - chest pain, dyspnea on exertion, edema or irregular heartbeat Gastrointestinal ROS: negative for - abdominal pain, diarrhea, hematemesis, nausea/vomiting or stool incontinence Genito-Urinary ROS: negative for - dysuria, hematuria, incontinence or urinary frequency/urgency Musculoskeletal ROS: negative for - joint swelling or muscular weakness Neurological ROS: as noted in HPI Dermatological ROS: negative for rash and skin lesion changes  Physical Examination: Blood pressure 136/98, pulse 124, temperature 98.5 F (36.9 C), temperature source Oral, resp. rate 18, height 5' 9.5" (1.765 m), weight 110.678 kg (244 lb).  Neurologic Examination Mental Status: Alert, oriented, thought content appropriate.  Speech fluent without evidence of aphasia.  Able to follow 3 step commands without difficulty. Cranial Nerves: II: Discs flat bilaterally; Visual fields grossly normal, pupils equal, round, reactive to light and accommodation III,IV, VI: ptosis not present, extra-ocular motions intact bilaterally.  Monocular diplopia noted bilaterally V,VII: smile symmetric, facial light touch sensation normal bilaterally VIII: hearing normal bilaterally IX,X: gag reflex present  XI: bilateral shoulder shrug XII: midline tongue extension Motor: Right : Upper extremity   5/5    Left:     Upper extremity   5/5  Lower extremity   5/5     Lower extremity   5/5 Tone and bulk:normal  tone throughout; no atrophy noted Sensory: Pinprick and light touch decreased in the lower extremities to the groin and in the upper extremities to the shoulders Deep Tendon Reflexes: 2+ and symmetric throughout Plantars: Right: mute   Left: mute Cerebellar: normal finger-to-nose and normal heel-to-shin test. Negative Romberg Gait: Ambulates with a cane.  When ambulates without the cane loses balance.   CV: pulses palpable throughout     Laboratory Studies:   Basic Metabolic Panel:  Recent Labs Lab 09/22/12 2100  NA 138  K 3.6  CL 103  CO2 24  GLUCOSE 87  BUN 9  CREATININE 0.78  CALCIUM 8.9    Liver Function Tests:  Recent Labs Lab 09/22/12 2100  AST 26  ALT 24  ALKPHOS 145*  BILITOT 0.2*  PROT 7.5  ALBUMIN 4.1   No results found for this basename: LIPASE, AMYLASE,  in the last 168 hours No results found for this basename: AMMONIA,  in the last 168 hours  CBC:  Recent Labs Lab 09/22/12 2100  WBC 5.7  HGB 12.0*  HCT 39.2  MCV 76.1*  PLT 319    Cardiac Enzymes: No results found for this basename: CKTOTAL, CKMB, CKMBINDEX, TROPONINI,  in the last 168 hours  BNP: No components found with this basename: POCBNP,   CBG: No results found for this basename: GLUCAP,  in the last 168 hours  Microbiology: No results found for this or any previous visit.  Coagulation Studies: No results found for this basename: LABPROT, INR,  in the last 72 hours  Urinalysis:  Recent Labs Lab 09/24/12 0615  COLORURINE AMBER*  LABSPEC 1.030  PHURINE 5.5  GLUCOSEU NEGATIVE  HGBUR NEGATIVE  BILIRUBINUR SMALL*  KETONESUR 15*  PROTEINUR NEGATIVE  UROBILINOGEN 0.2  NITRITE NEGATIVE  LEUKOCYTESUR NEGATIVE    Lipid Panel:  No results found for this basename: chol, trig, hdl, cholhdl, vldl, ldlcalc    HgbA1C:  No results found for this basename: HGBA1C    Urine Drug Screen:     Component Value Date/Time   LABOPIA NONE DETECTED 09/22/2012 2045   LABOPIA NEG  06/15/2010 0000   COCAINSCRNUR NONE DETECTED 09/22/2012 2045   COCAINSCRNUR NEG 06/15/2010 0000   LABBENZ POSITIVE* 09/22/2012 2045   LABBENZ POS* 06/15/2010 0000   AMPHETMU POSITIVE* 09/22/2012 2045   AMPHETMU POS* 06/15/2010 0000   THCU NONE DETECTED 09/22/2012 2045   LABBARB NONE DETECTED 09/22/2012 2045    Alcohol Level:  Recent Labs Lab 09/22/12 2100  ETH <11    Imaging: CT of head-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 No evidence of acute intracranial abnormality.  No progression from that seen on MRI of 08/17/12   Assessment/Plan: 47 year old male with a history of depression admitted after mental status changes seen after overdose on prescription medications.  Patient seems at baseline at this time with no altered mental status noted on evaluation at this time.  Patient does have a history of gait abnormalities.  These are likely related to his peripheral neuropathy that has been worked up on an outpatient basis.  Diplopia again is long-standing and monocular.  This does suggest an ocular etiology and the patient has been seen by an ophthalmologist.  Recommendations: 1.  No further neurologic intervention is recommended at this time.  If further questions arise, please call or page at that time.  Thank you for allowing neurology to participate in the care of this patient.  Thana Farr, MD [facility] [phone] 09/26/2012  6:29 PM
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
## 2012-09-26 NOTE — Progress Notes (Signed)
D: Patient denies SI/HI and A/V hallucinations; patient reports sleep to be well; reports appetite to be good ; reports energy level is high ; reports ability to pay attention is good; rates depression as 0/10; rates hopelessness 0/10; rates anxiety as 0/10;   A: Monitored q 15 minutes; patient encouraged to attend groups; patient educated about medications; patient given medications per physician orders; patient encouraged to express feelings and/or concerns  R: Patient denies need for his admission here ans states he takes care of everyone else; patient's interaction with staff and peers is appropriate; ; patient is taking medications as prescribed and tolerating medications; patient is attending all groups

## 2012-09-26 NOTE — Progress Notes (Signed)
Nutrition Brief Note  Patient identified on the Malnutrition Screening Tool (MST) Report  Body mass index is 35.53 kg/(m^2). Patient meets criteria for obesity grade 2 based on current BMI.   Patient states a UBW of 270 lbs 1 year ago.  Now 244 lbs.  Hx of Gastric Bypass in 2001. Reports trying to follow an organic vegetarian diet and usually eats well at home.   Patient with anemia.  Current diet order is regular, patient reports fair intake of meals at this time. Labs and medications reviewed.   No nutrition interventions warranted at this time. If nutrition issues arise, please consult RD.   Oran Rein, RD, LDN Clinical Inpatient Dietitian Pager:  [phone] Weekend and after hours pager:  [phone]

## 2012-09-26 NOTE — Progress Notes (Signed)
Recreation Therapy Notes  09/26/2012  Time: 3:00pm   Group Topic/Focus: Time Management   Participation Level:  Active   Participation Quality:  Appropriate   Affect:  Bright   Cognitive:  Appropriate   Additional Comments: Patient completed "Leisure Time Clock" worksheet. Patient participated in group discussion about using leisure time effectively.   Alfredia Desanctis L Kempton Milne, LRT/CTRS

## 2012-09-26 NOTE — Tx Team (Signed)
Initial Interdisciplinary Treatment Plan  PATIENT STRENGTHS: (choose at least two) Average or above average intelligence Capable of independent living Physical Health Supportive family/friends  PATIENT STRESSORS: Educational concerns Health problems Marital or family conflict   PROBLEM LIST: Problem List/Patient Goals Date to be addressed Date deferred Reason deferred Estimated date of resolution   "I have no idea why I'm here"  09/25/12           Depression with SI   09/25/12                                          DISCHARGE CRITERIA:  Ability to meet basic life and health needs Adequate post-discharge living arrangements Improved stabilization in mood, thinking, and/or behavior Medical problems require only outpatient monitoring Motivation to continue treatment in a less acute level of care Need for constant or close observation no longer present Reduction of life-threatening or endangering symptoms to within safe limits Safe-care adequate arrangements made Verbal commitment to aftercare and medication compliance  PRELIMINARY DISCHARGE PLAN: Return to previous living arrangement  PATIENT/FAMIILY INVOLVEMENT: This treatment plan has been presented to and reviewed with the patient, MELBOURNE JAKUBIAK, and/or family member.  The patient and family have been given the opportunity to ask questions and make suggestions.  Armonee Bojanowski ANN 09/26/2012, 12:01 AM

## 2012-09-27 DIAGNOSIS — F329 Major depressive disorder, single episode, unspecified: Principal | ICD-10-CM

## 2012-09-27 NOTE — Progress Notes (Signed)
[facility] ED CM answered a call in [facility] from pt's mother inquiring if she could speak with him CM spoke with ED psych RN and provided mother with [phone] Mother reports being hard of hearing and that the pt has called her to come "bring him home" and "to call his psychiatrist"

## 2012-09-27 NOTE — Progress Notes (Signed)
  D) Patient quiet but cooperative upon my assessment. Patient verbalizes "I just really want to get out of here please." Patient completed Patient Self Inventory, reports slept "well," and  appetite is "good." Patient rates  hopeless feelings as 1 /10. Patient denies SI/HI, denies A/V hallucinations. Patient c/o back pain "because I don't have any shoes." Patient given shoes from locker per MD Daleen Bo.   A) Patient offered support and encouragement, patient encouraged to discuss feelings/concerns with staff. Patient verbalized understanding. Patient monitored Q15 minutes for safety. Patient met with MD  to discuss today's goals and plan of care.  R) Patient visible in milieu, attending groups in day room and meals in dining room. Patient appropriate with staff and peers.   Patient taking medications as ordered. Will continue to monitor.

## 2012-09-27 NOTE — Progress Notes (Signed)
Adult Psychoeducational Group Note  Date:  09/27/2012 Time:  1:23 AM  Group Topic/Focus:  Wrap-Up Group:   The focus of this group is to help patients review their daily goal of treatment and discuss progress on daily workbooks.  Participation Level:  Active  Participation Quality:  Appropriate and Attentive  Affect:  Appropriate and Flat  Cognitive:  Alert and Appropriate  Insight: Appropriate and Good  Engagement in Group:  Improving  Modes of Intervention:  Discussion   Dalia Heading 09/27/2012, 1:23 AM

## 2012-09-27 NOTE — Progress Notes (Signed)
Patient ID: Kevin Reed, male   DOB: 07/08/1966, 47 y.o.   MRN: [id number] D: Pt presented with depressed mood and flat affect. Pt denies SI/HI/AVH. Pt attended evening wrap up group and Interacted appropriately with peers. Pt minimize his problems and stated "he doesn't need to be here and wants to go home". Cooperative with assessment. No acute distressed noted at this time.   A: Met with pt 1:1. Writer encourage pt to make good use of his stay here and participate in groups. Medications administered as prescribed. Writer encouraged pt to discuss feelings. Pt encouraged to come to staff with any question or concerns.  R: Patient remains safe. He is complaint with medications and group programming. Continue current POC.
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
## 2012-09-27 NOTE — Progress Notes (Signed)
[facility] MD Progress Note  09/27/2012 2:29 PM Kevin Reed  MRN:  [id number] Subjective:  Patient reports doing well. He continues to be evasive about his reasons for admission, he does agree he was overusing his stimulant/other medication. Focused on being discharged.  Diagnosis:   Axis I: Depressive Disorder NOS Axis II: Deferred Axis III:  Past Medical History  Diagnosis Date  . Allergy   . Anemia   . Anxiety   . Depression   . Neuromuscular disorder    Axis IV: other psychosocial or environmental problems Axis V: 51-60 moderate symptoms  ADL's:  Intact  Sleep: Fair  Appetite:  Fair  Psychiatric Specialty Exam: Review of Systems  Constitutional: Negative.   HENT: Negative.   Eyes: Negative.   Respiratory: Negative.   Cardiovascular: Negative.   Gastrointestinal: Negative.   Genitourinary: Negative.   Musculoskeletal: Negative.   Skin: Negative.   Neurological: Negative.   Endo/Heme/Allergies: Negative.   Psychiatric/Behavioral: Positive for depression.    Blood pressure 136/98, pulse 124, temperature 98.5 F (36.9 C), temperature source Oral, resp. rate 18, height 5' 9.5" (1.765 m), weight 110.678 kg (244 lb).Body mass index is 35.53 kg/(m^2).  General Appearance: Casual  Eye Contact::  Fair  Speech:  Soft, mumbling  Volume:  Decreased  Mood:  Dysphoric  Affect:  Flat  Thought Process:  Coherent  Orientation:  Full (Time, Place, and Person)  Thought Content:  WDL  Suicidal Thoughts:  No  Homicidal Thoughts:  No  Memory:  Immediate;   Poor Recent;   Poor Remote;   Poor  Judgement:  Impaired  Insight:  Shallow  Psychomotor Activity:  Decreased  Concentration:  Fair  Recall:  Poor  Akathisia:  No  Handed:  Right  AIMS (if indicated):     Assets:  Communication Skills Desire for Improvement Housing Social Support  Sleep:  Number of Hours: 4.5   Current Medications: Current Facility-Administered Medications  Medication Dose Route Frequency Provider  Last Rate Last Dose  . acetaminophen (TYLENOL) tablet 650 mg  650 mg Oral Q6H PRN Nehemiah Settle, MD   650 mg at 09/27/12 1047  . alum & mag hydroxide-simeth (MAALOX/MYLANTA) 200-200-20 MG/5ML suspension 30 mL  30 mL Oral Q4H PRN Nehemiah Settle, MD      . amphetamine-dextroamphetamine (ADDERALL XR) 24 hr capsule 30 mg  30 mg Oral BH-q7a Nehemiah Settle, MD   30 mg at 09/27/12 0744  . diazepam (VALIUM) tablet 10 mg  10 mg Oral QHS Nehemiah Settle, MD   10 mg at 09/26/12 2257  . magnesium hydroxide (MILK OF MAGNESIA) suspension 30 mL  30 mL Oral Daily PRN Nehemiah Settle, MD      . methocarbamol (ROBAXIN) tablet 500 mg  500 mg Oral BID PRN Nehemiah Settle, MD      . nicotine (NICODERM CQ - dosed in mg/24 hours) patch 14 mg  14 mg Transdermal Daily Kerry Hough, PA   14 mg at 09/27/12 0704  . traMADol (ULTRAM) tablet 50 mg  50 mg Oral Q12H Nehemiah Settle, MD   50 mg at 09/27/12 2952    Lab Results:  Results for orders placed during the hospital encounter of 09/25/12 (from the past 48 hour(s))  T4, FREE     Status: None   Collection Time    09/26/12  8:37 PM      Result Value Range   Free T4 1.09  0.80 - 1.80 ng/dL  TSH  Status: None   Collection Time    09/26/12  8:37 PM      Result Value Range   TSH 0.849  0.350 - 4.500 uIU/mL    Physical Findings: AIMS: Facial and Oral Movements Muscles of Facial Expression: None, normal Lips and Perioral Area: None, normal Jaw: None, normal Tongue: None, normal,Extremity Movements Upper (arms, wrists, hands, fingers): None, normal Lower (legs, knees, ankles, toes): None, normal, Trunk Movements Neck, shoulders, hips: None, normal, Overall Severity Severity of abnormal movements (highest score from questions above): None, normal Incapacitation due to abnormal movements: None, normal Patient's awareness of abnormal movements (rate only patient's report): No Awareness,  Dental Status Current problems with teeth and/or dentures?: No Does patient usually wear dentures?: No  CIWA:  CIWA-Ar Total: 0 COWS:     Treatment Plan Summary: Daily contact with patient to assess and evaluate symptoms and progress in treatment Medication management  Plan: Discussed with patient that we need more information on what led to his hospitalization before we discharge him. It was also discussed that since he was admitted with altered mental status we will need to ensure collateral information, follow up appointments with his psychiatrist and therapist prior to discharge. Patient ia more clear today but continues to be evasive. Continue current plan of care.  Medical Decision Making Problem Points:  Established problem, stable/improving (1), Review of last therapy session (1) and Review of psycho-social stressors (1) Data Points:  Review of medication regiment & side effects (2)  I certify that inpatient services furnished can reasonably be expected to improve the patient's condition.   Omer Monter 09/27/2012, 2:29 PM

## 2012-09-28 DIAGNOSIS — F329 Major depressive disorder, single episode, unspecified: Principal | ICD-10-CM | POA: Diagnosis present

## 2012-09-28 DIAGNOSIS — F411 Generalized anxiety disorder: Secondary | ICD-10-CM

## 2012-09-28 MED ORDER — AMPHETAMINE-DEXTROAMPHET ER 30 MG PO CP24: 30.0000 mg | ORAL_CAPSULE | ORAL | Status: DC

## 2012-09-28 MED ORDER — TRAMADOL HCL 50 MG PO TABS: 50.0000 mg | ORAL_TABLET | Freq: Two times a day (BID) | ORAL | Status: DC

## 2012-09-28 MED ORDER — DIAZEPAM 10 MG PO TABS: 10.0000 mg | ORAL_TABLET | Freq: Every day | ORAL | Status: DC

## 2012-09-28 NOTE — Progress Notes (Signed)
Patient ID: Kevin Reed, male   DOB: [birth year], 47 y.o.   MRN: [id number] D: Pt presented with depressed mood and blunted affect. Pt denies SI/HI/AVH. Pt attended evening activities in the gym. Pt is paranoid and "believes his wife is controlling all his activities". Pt minimize his problems and stated "he doesn't need to be here and wants to go home". Cooperative with assessment. No acute distressed noted at this time.   A: Met with pt 1:1. Writer encourage pt to make good use of his stay here and participate in groups. Medications administered as prescribed. Writer encouraged pt to discuss feelings. Pt encouraged to come to staff with any question or concerns.  R: Patient remains safe. He is complaint with medications and group programming. Continue current POC.

## 2012-09-28 NOTE — Progress Notes (Signed)
Patient ID: Kevin Reed, male   DOB: March 09, 1966, 47 y.o.   MRN: [id number] Spoke to pt mom - Doris @2008 . Mom was c/o about pt discharging too early.

## 2012-09-28 NOTE — Progress Notes (Signed)
[facility] Adult Case Management Discharge Plan :  Will you be returning to the same living situation after discharge: Yes,  Patient to discharge to mother's home At discharge, do you have transportation home?:Yes,  Patient is arranging transportation home Do you have the ability to pay for your medications:No.  Patient to be assisted with medications.  Release of information consent forms completed and in the chart;  Patient's signature needed at discharge.  Patient to Follow up at: Follow-up Information   Follow up with Dr. Evelene Croon On 10/05/2012. (You are scheduled with Dr. Evelene Croon on Friday, October 05, 2012 at 5:30 PM)    Contact information:   [address] [place], [place]   [address]  [phone]      Patient denies SI/HI:   Yes,  Patient is not endorsing SI/HI or other thoughts of self harm.    Safety Planning and Suicide Prevention discussed:  Yes,  Reviewed with patient individually.  Wynn Banker 09/28/2012, 11:47 AM
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
## 2012-09-28 NOTE — Progress Notes (Signed)
Patient denies SI/HI, denies A/V hallucinations. Patient verbalizes understanding of discharge instructions, follow up care and prescriptions. Patient given all belongings from [facility] locker. Patient escorted out by staff, transported by public transportation.  

## 2012-09-28 NOTE — [facility] Suicide Risk Assessment (Signed)
Suicide Risk Assessment  Discharge Assessment     Demographic Factors:  Male and Divorced or widowed  Mental Status Per Nursing Assessment::   On Admission:  Suicidal ideation indicated by others;Suicide plan  Current Mental Status by Physician: Alert and oriented to 4. Denies aH/VH/SI/HI.  Loss Factors: Loss of significant relationship  Historical Factors: Impulsivity  Risk Reduction Factors:   Sense of responsibility to family and Positive coping skills or problem solving skills  Continued Clinical Symptoms:  Depression:   Recent sense of peace/wellbeing  Cognitive Features That Contribute To Risk:  Cognitively intact  Suicide Risk:  Minimal: No identifiable suicidal ideation.  Patients presenting with no risk factors but with morbid ruminations; may be classified as minimal risk based on the severity of the depressive symptoms  Discharge Diagnoses:   AXIS I:  Major Depression, Recurrent severe AXIS II:  Deferred AXIS III:   Past Medical History  Diagnosis Date  . Allergy   . Anemia   . Anxiety   . Depression   . Neuromuscular disorder    AXIS IV:  other psychosocial or environmental problems AXIS V:  61-70 mild symptoms  Plan Of Care/Follow-up recommendations:  Activity:  as tolerated Diet:  regular Follow up with outpatient appointments.  Is patient on multiple antipsychotic therapies at discharge:  No   Has Patient had three or more failed trials of antipsychotic monotherapy by history:  No  Recommended Plan for Multiple Antipsychotic Therapies: NA  Marenda Accardi 09/28/2012, 10:00 AM

## 2012-10-02 NOTE — Discharge Summary (Signed)
Reviewed

## 2012-10-03 NOTE — Progress Notes (Signed)
Patient Discharge Instructions:  After Visit Summary (AVS):   Faxed to:  10/03/12 Discharge Summary Note:   Faxed to:  10/03/12 Psychiatric Admission Assessment Note:   Faxed to:  10/03/12 Suicide Risk Assessment - Discharge Assessment:   Faxed to:  10/03/12 Faxed/Sent to the [facility] Care provider:  10/03/12 Faxed to Dr. Evelene Croon @ [phone]  Jerelene Redden, 10/03/2012, 3:59 PM

## 2012-10-09 ENCOUNTER — Encounter: Payer: 59 | Attending: Physical Medicine & Rehabilitation

## 2012-10-09 ENCOUNTER — Ambulatory Visit: Payer: 59 | Admitting: Physical Medicine & Rehabilitation

## 2012-10-16 ENCOUNTER — Ambulatory Visit: Payer: 59 | Admitting: Physical Medicine & Rehabilitation

## 2012-10-18 ENCOUNTER — Encounter: Payer: 59 | Attending: Physical Medicine & Rehabilitation

## 2012-10-18 ENCOUNTER — Ambulatory Visit: Payer: 59 | Admitting: Physical Medicine & Rehabilitation

## 2012-10-29 ENCOUNTER — Telehealth: Payer: Self-pay | Admitting: Physical Medicine & Rehabilitation

## 2012-10-29 NOTE — Telephone Encounter (Signed)
Patient want's to reschedule an appointment with Dr. Wynn Banker.  Need to make sure that we can reschedule due to missed appointments.

## 2012-10-29 NOTE — Telephone Encounter (Signed)
If patient has missed 3 or more appointments then do not reschedule

## 2012-10-30 ENCOUNTER — Telehealth: Payer: Self-pay | Admitting: Physical Medicine & Rehabilitation

## 2012-10-30 NOTE — Telephone Encounter (Signed)
Just spoke with patient about setting up another appointment, and explained to him that he has missed 3 or more appointments and we would not reschedule an appointment.  He wanted a referral, but I explained to him that he would need to go back to his primary doctor to get a referral.    Just need to know if a discharge letter is being sent to him.

## 2012-10-30 NOTE — Telephone Encounter (Signed)
Discharge Letter sent.

## 2012-10-30 NOTE — Telephone Encounter (Signed)
Yes send d/c note

## 2012-11-08 ENCOUNTER — Telehealth: Payer: Self-pay | Admitting: Physical Medicine & Rehabilitation

## 2012-11-08 NOTE — Telephone Encounter (Signed)
Spoke with Demetrio Lapping at [facility].  Patient is seeing Dr. Tally Joe in that office and Dr. Azucena Cecil is requesting records for treatment.  Faxed records.

## 2012-12-05 ENCOUNTER — Ambulatory Visit: Payer: Self-pay | Admitting: Family Medicine

## 2012-12-05 VITALS — BP 150/98 | HR 107 | Temp 98.8°F | Resp 16 | Ht 70.4 in | Wt 240.8 lb

## 2012-12-05 DIAGNOSIS — Z0000 Encounter for general adult medical examination without abnormal findings: Secondary | ICD-10-CM

## 2012-12-05 DIAGNOSIS — Z0289 Encounter for other administrative examinations: Secondary | ICD-10-CM

## 2012-12-05 NOTE — Progress Notes (Signed)
[facility] [address] [place] [address] [phone]  Date:  12/05/2012   Name:  Kevin Reed   DOB:  Apr 07, 1966   MRN:  [id number]  PCP:  Kevin Hoff, MD    Chief Complaint: needs dmv form filled out   History of Present Illness:  Kevin Reed is a 47 y.o. very pleasant male patient who presents with the following:  He needs DMV paperwork completed today.  There is a question about his continuing to drive- however we do not have any current medical records as he is a new patient to [place] today.    He has a history of ADHD.  He has been diagnosed with narcolepsy per Dr. Vassie Reed at [place], but states that his sleep study may have been done incorreclty.  He is seeing Dr. Vickey Reed for a second opinion.    His PCP is Dr. Azucena Reed, but he was not able to get in to see him today.  Called Dr. Merita Reed office at [facility] and spoke to one of his partners- it appears Kevin Reed had tried to have these forms completed by Dr. Azucena Reed, but he was not able to do so due to possible narcolepsy/ catoplexy.    Dr Kevin Reed has completed the psychiatric part of his DMV form, and Dr. Dione Reed has completed the vision portion.  Per Dr. Carie Reed note he is ok to drive.   Patient Active Problem List  Diagnosis  . HYPERSOMNIA, PERSISTENT  . OTHER CHRONIC PAIN  . NARCOLEPSY WITH CATAPLEXY  . SLEEP APNEA  . Carpal tunnel syndrome on both sides  . Subacromial or subdeltoid bursitis  . Myalgia and myositis, unspecified  . Unspecified hereditary and idiopathic peripheral neuropathy  . Depressive disorder, not elsewhere classified    Past Medical History  Diagnosis Date  . Allergy   . Anemia   . Anxiety   . Depression   . Neuromuscular disorder     Past Surgical History  Procedure Laterality Date  . Gastric bypass  2001  . Carpal tunnel release      History  Substance Use Topics  . Smoking status: Current Every Day Smoker -- 0.30 packs/day for 30 years    Types: Cigarettes  .  Smokeless tobacco: Never Used  . Alcohol Use: No    Family History  Problem Relation Age of Onset  . Cancer Father 23    colon and bladder cancer  . Diabetes Father   . Prostate cancer Father   . Prostate cancer Brother     Allergies  Allergen Reactions  . Lamictal (Lamotrigine) Rash    Medication list has been reviewed and updated.  Current Outpatient Prescriptions on File Prior to Visit  Medication Sig Dispense Refill  . amphetamine-dextroamphetamine (ADDERALL XR) 30 MG 24 hr capsule Take 1 capsule (30 mg total) by mouth every morning. For attention  1 capsule  0  . diazepam (VALIUM) 10 MG tablet Take 1 tablet (10 mg total) by mouth at bedtime. For muscle spasm/sleep/anxiety  1 tablet  0  . methocarbamol (ROBAXIN) 500 MG tablet Take 500 mg by mouth 2 (two) times daily as needed (for muscle spasm or pain).       No current facility-administered medications on file prior to visit.    Review of Systems:  As per HPI- otherwise negative.   Physical Examination: Filed Vitals:   12/05/12 0839  BP: 150/98  Pulse: 107  Temp: 98.8 F (37.1 C)  Resp: 16  Filed Vitals:   12/05/12 0839  Height: 5' 10.4" (1.788 m)  Weight: 240 lb 12.8 oz (109.226 kg)   Body mass index is 34.17 kg/(m^2). Ideal Body Weight: Weight in (lb) to have BMI = 25: 175.9  GEN: WDWN, NAD, Non-toxic, A & O x 3, obese HEENT: Atraumatic, Normocephalic. Neck supple. No masses, No LAD. Ears and Nose: No external deformity. CV: RRR, No M/G/R. No JVD. No thrill. No extra heart sounds. PULM: CTA B, no wheezes, crackles, rhonchi. No retractions. No resp. distress. No accessory muscle use. ABD: S, NT, ND, +BS. No rebound. No HSM. EXTR: No c/c/e NEURO Normal gait. Normal strength, ROM and DTR normal in all extremities PSYCH: unusual affect, confusing and non- linear thought pattern. No gross psychosis or hallucinations apparent.    Assessment and Plan: Physical exam, annual  Pt with complex medical  history seeking completion of DMV paperwork today as a new patient. Explained to Kevin Reed that I will need to obtain extensive medical records prior to determining if he can drive as there is a question of narcolepsy.  Offered to refund his money and have him follow- up with his PCP vs trying to obtain med records and attempting to sort out this issue.   He would prefer for me to obtain records.  Let him know this process will take several days, and I cannot guarantee that I will be able to recommend that he drive.  He understands and wishes to proceed   Signed Kevin Amsterdam, MD

## 2012-12-11 ENCOUNTER — Telehealth: Payer: Self-pay | Admitting: Radiology

## 2012-12-11 ENCOUNTER — Telehealth: Payer: Self-pay | Admitting: Family Medicine

## 2012-12-11 NOTE — Telephone Encounter (Signed)
Patient has appt with Dr Clarisse Gouge tomorrow, appt with sleep center is May 5th. States he is on a time delay. States has papers for [facility]. Advised his mother

## 2012-12-11 NOTE — Telephone Encounter (Signed)
Sorry/ advised his wife/ to come in after these appts with notes for DMV papers to be completed. Understand time delay, but must have records from these 2 places to complete paperwork for [facility]

## 2012-12-11 NOTE — Telephone Encounter (Signed)
Received notes from Dr. Vickey Huger from 01/28/2011- stating that he was suspected of having narcolepsy/ catoplexy and that he was advised not to drive. Per his report he will have a follow- up with her tomorrow. Let him know I will watch for these additional records so we can determine his driving status.

## 2012-12-17 ENCOUNTER — Encounter: Payer: Self-pay | Admitting: Neurology

## 2012-12-17 ENCOUNTER — Ambulatory Visit (INDEPENDENT_AMBULATORY_CARE_PROVIDER_SITE_OTHER): Payer: 59 | Admitting: Neurology

## 2012-12-17 VITALS — BP 155/105 | HR 95 | Temp 99.1°F | Ht 70.0 in | Wt 241.0 lb

## 2012-12-17 DIAGNOSIS — G4713 Recurrent hypersomnia: Secondary | ICD-10-CM | POA: Insufficient documentation

## 2012-12-17 NOTE — Patient Instructions (Addendum)
Hypersomnia Hypersomnia usually brings recurrent episodes of excessive daytime sleepiness or prolonged nighttime sleep. It is different than feeling tired due to lack of or interrupted sleep at night. People with hypersomnia are compelled to nap repeatedly during the day. This is often at inappropriate times such as:  At work.  During a meal.  In conversation. These daytime naps usually provide no relief. This disorder typically affects adolescents and young adults. CAUSES  This condition may be caused by:  Another sleep disorder (such as narcolepsy or sleep apnea).  Dysfunction of the autonomic nervous system.  Drug or alcohol abuse.  A physical problem, such as:  A tumor.  Head trauma. This is damage caused by an accident.  Injury to the central nervous system.  Certain medications, or medicine withdrawal.  Medical conditions may contribute to the disorder, including:  Multiple sclerosis.  Depression.  Encephalitis.  Epilepsy.  Obesity.  Some people appear to have a genetic predisposition to this disorder. In others, there is no known cause. SYMPTOMS   Patients often have difficulty waking from a long sleep. They may feel dazed or confused.  Other symptoms may include:  Anxiety.  Increased irritation (inflammation).  Decreased energy.  Restlessness.  Slow thinking.  Slow speech.  Loss of appetite.  Hallucinations.  Memory difficulty.  Tremors, Tics.  Some patients lose the ability to function in family, social, occupational, or other settings. TREATMENT  Treatment is symptomatic in nature. Stimulants and other drugs may be used to treat this disorder. Changes in behavior may help. For example, avoid night work and social activities that delay bed time. Changes in diet may offer some relief. Patients should avoid alcohol and caffeine. PROGNOSIS  The likely outcome (prognosis) for persons with hypersomnia depends on the cause of the disorder.  The disorder itself is not life threatening. But it can have serious consequences. For example, automobile accidents can be caused by falling asleep while driving. The attacks usually continue indefinitely. Document Released: 07/22/2002 Document Revised: 10/24/2011 Document Reviewed: 06/25/2008 [facility] Patient Information 2013 [place], [place].

## 2012-12-17 NOTE — Assessment & Plan Note (Signed)
Will refill nuvigil for this patient , whose EDS responds to nuvigil and olanzepine. He has  Dr . Evelene Croon as prescriber for Adderall and olanzepin, as well as Nuvigil.
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
## 2012-12-21 ENCOUNTER — Encounter: Payer: Self-pay | Admitting: Neurology

## 2012-12-22 ENCOUNTER — Telehealth: Payer: Self-pay | Admitting: Family Medicine

## 2012-12-22 NOTE — Telephone Encounter (Signed)
Called Vivek but no answer.

## 2012-12-24 ENCOUNTER — Telehealth: Payer: Self-pay

## 2012-12-24 NOTE — Telephone Encounter (Signed)
Do you want to have the neurologist advise on his driving?

## 2012-12-24 NOTE — Telephone Encounter (Signed)
Called again- no answer.  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 Dr. Evelene Croon has already cleared him to drive from her notes, but I am not able to provide any further documentation.  He can come and pick up the paperwork at his convenience.

## 2012-12-24 NOTE — Telephone Encounter (Signed)
PATIENT'S MOTHER IS CALLING ABOUT A NEUROLOGICAL ISSUES AND HER SON BEING ABLE TO DRIVE AGAIN. STATES THAT HE SAW DR. Patsy Lager. [phone].

## 2012-12-25 NOTE — Telephone Encounter (Addendum)
Called and was able to speak with Kevin Reed.  I have received a copy of the letter from Dr. Evelene Reed stating that he is compliant with his medications.  Dr. Evelene Reed also completed the "pateint's medical history" portion of the form, indicating that he could drive, but wrote "possibly" under the MSK disorder section.  Per my exam and history he does not have a current MSK disorder. His MSK exam was normal at our visit. I did complete the MSK portion of the DMV form only.  However, counseled Kevin Reed that I am not able to make a determination about whether he should drive otherwise.  He has taken this under advisement and states he does plan to follow- up with neurology and have a sleep study.    Of note Dr. Carie Caddy note dated 12/01/12 states the he was having pain with walking and using a cane.  However, note from [facility] dated 12/12/12 states that he no longer needs the cane to walk, and that he had been exercising and overall feeling better.  Normal gait and strength testing documented at that visit as well.    Kevin Reed will come and pick up his forms at his convenience.

## 2012-12-28 ENCOUNTER — Institutional Professional Consult (permissible substitution): Payer: Self-pay | Admitting: Neurology

## 2012-12-28 ENCOUNTER — Telehealth: Payer: Self-pay

## 2012-12-28 NOTE — Telephone Encounter (Signed)
Do you have the forms ?

## 2012-12-28 NOTE — Telephone Encounter (Signed)
Forms not located in scan stack, he has no paper chart. It was perhaps sent out for scanning already. I will ck again on Monday. Corrissa Martello

## 2012-12-28 NOTE — Telephone Encounter (Signed)
Patient's wife called about paperwork for DMV to be picked up. Dr. Cyndie Chime notes say he is free to pick them up at his convenience but they are not in the pick up drawer nor in her box. Told patient to call back Tuesday when Dr. Patsy Lager is back for more info. Hopefully we can track them down before then.

## 2012-12-28 NOTE — Telephone Encounter (Signed)
I put them up front for him to pick up- there was a large amount of papers, so might have been put somewhere else?  Also look in his paper chart, perhaps were filed there

## 2012-12-30 NOTE — Telephone Encounter (Signed)
Called to speak with [name]- let him know that I am so sorry his forms got misplaced, and I will find his forms for him or I will get a new set and help him get them filled out again.  He has until June 11th to turn them in, which is good and he was very understanding.

## 2012-12-31 NOTE — Telephone Encounter (Signed)
Will continue to look for this. Dr Patsy Lager has redone the form and called the patient.

## 2013-01-02 ENCOUNTER — Telehealth: Payer: Self-pay | Admitting: Neurology

## 2013-01-04 NOTE — Telephone Encounter (Signed)
done

## 2018-06-14 ENCOUNTER — Encounter (HOSPITAL_COMMUNITY): Payer: Self-pay

## 2018-06-14 ENCOUNTER — Emergency Department (HOSPITAL_COMMUNITY): Payer: Medicare HMO

## 2018-06-14 ENCOUNTER — Other Ambulatory Visit: Payer: Self-pay

## 2018-06-14 ENCOUNTER — Emergency Department (HOSPITAL_COMMUNITY)
Admission: EM | Admit: 2018-06-14 | Discharge: 2018-06-14 | Disposition: A | Discharge status: Home / Self Care | Payer: Medicare HMO | Attending: Emergency Medicine | Admitting: Emergency Medicine

## 2018-06-14 DIAGNOSIS — F329 Major depressive disorder, single episode, unspecified: Secondary | ICD-10-CM | POA: Diagnosis not present

## 2018-06-14 DIAGNOSIS — L03119 Cellulitis of unspecified part of limb: Secondary | ICD-10-CM

## 2018-06-14 DIAGNOSIS — Z79899 Other long term (current) drug therapy: Secondary | ICD-10-CM | POA: Diagnosis not present

## 2018-06-14 DIAGNOSIS — F17210 Nicotine dependence, cigarettes, uncomplicated: Secondary | ICD-10-CM | POA: Diagnosis not present

## 2018-06-14 DIAGNOSIS — M79671 Pain in right foot: Secondary | ICD-10-CM | POA: Diagnosis present

## 2018-06-14 LAB — URINALYSIS, ROUTINE W REFLEX MICROSCOPIC
BILIRUBIN URINE: NEGATIVE
Bacteria, UA: NONE SEEN
GLUCOSE, UA: 150 mg/dL — AB
HGB URINE DIPSTICK: NEGATIVE
Ketones, ur: 5 mg/dL — AB
Leukocytes, UA: NEGATIVE
NITRITE: NEGATIVE
PROTEIN: 100 mg/dL — AB
Specific Gravity, Urine: 1.013 (ref 1.005–1.030)
pH: 6 (ref 5.0–8.0)

## 2018-06-14 LAB — COMPREHENSIVE METABOLIC PANEL
ALT: 25 U/L (ref 0–44)
ANION GAP: 12 (ref 5–15)
AST: 30 U/L (ref 15–41)
Albumin: 4.6 g/dL (ref 3.5–5.0)
Alkaline Phosphatase: 73 U/L (ref 38–126)
BUN: 12 mg/dL (ref 6–20)
CHLORIDE: 98 mmol/L (ref 98–111)
CO2: 26 mmol/L (ref 22–32)
CREATININE: 1.47 mg/dL — AB (ref 0.61–1.24)
Calcium: 9 mg/dL (ref 8.9–10.3)
GFR, EST NON AFRICAN AMERICAN: 54 mL/min — AB (ref >60)
Glucose, Bld: 111 mg/dL — ABNORMAL HIGH (ref 70–99)
Potassium: 3.1 mmol/L — ABNORMAL LOW (ref 3.5–5.1)
Sodium: 136 mmol/L (ref 135–145)
Total Bilirubin: 0.7 mg/dL (ref 0.3–1.2)
Total Protein: 8.2 g/dL — ABNORMAL HIGH (ref 6.5–8.1)

## 2018-06-14 LAB — CBC WITH DIFFERENTIAL/PLATELET
Abs Immature Granulocytes: 0.03 10*3/uL (ref 0.00–0.07)
BASOS ABS: 0.1 10*3/uL (ref 0.0–0.1)
Basophils Relative: 1 %
EOS PCT: 1 %
Eosinophils Absolute: 0.1 10*3/uL (ref 0.0–0.5)
HCT: 44.1 % (ref 39.0–52.0)
Hemoglobin: 14.5 g/dL (ref 13.0–17.0)
Immature Granulocytes: 0 %
Lymphocytes Relative: 20 %
Lymphs Abs: 2 10*3/uL (ref 0.7–4.0)
MCH: 27.7 pg (ref 26.0–34.0)
MCHC: 32.9 g/dL (ref 30.0–36.0)
MCV: 84.2 fL (ref 80.0–100.0)
Monocytes Absolute: 1 10*3/uL (ref 0.1–1.0)
Monocytes Relative: 10 %
NEUTROS PCT: 68 %
NRBC: 0 % (ref 0.0–0.2)
Neutro Abs: 6.9 10*3/uL (ref 1.7–7.7)
PLATELETS: 414 10*3/uL — AB (ref 150–400)
RBC: 5.24 MIL/uL (ref 4.22–5.81)
RDW: 15.6 % — ABNORMAL HIGH (ref 11.5–15.5)
WBC: 10.2 10*3/uL (ref 4.0–10.5)

## 2018-06-14 LAB — I-STAT CG4 LACTIC ACID, ED
Lactic Acid, Venous: 2.1 mmol/L (ref 0.5–1.9)
Lactic Acid, Venous: 1.43 mmol/L (ref 0.5–1.9)

## 2018-06-14 MED ORDER — SODIUM CHLORIDE 0.9 % IV BOLUS
500.0000 mL | Freq: Once | INTRAVENOUS | Status: AC
  Administered 2018-06-14: 500 mL via INTRAVENOUS

## 2018-06-14 MED ORDER — ACETAMINOPHEN 325 MG PO TABS
650.0000 mg | ORAL_TABLET | Freq: Once | ORAL | Status: AC
Start: 2018-06-14 — End: 2018-06-14
  Administered 2018-06-14: 650 mg via ORAL
  Filled 2018-06-14: qty 2

## 2018-06-14 MED ORDER — CLINDAMYCIN PHOSPHATE 600 MG/50ML IV SOLN
600.0000 mg | Freq: Once | INTRAVENOUS | Status: AC
  Administered 2018-06-14: 600 mg via INTRAVENOUS
  Filled 2018-06-14: qty 50

## 2018-06-14 MED ORDER — CLINDAMYCIN HCL 150 MG PO CAPS: 300.0000 mg | ORAL_CAPSULE | Freq: Three times a day (TID) | ORAL | 0 refills | Status: AC
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
## 2018-06-14 NOTE — ED Provider Notes (Signed)
Medical screening examination/treatment/procedure(s) were conducted as a shared visit with non-physician practitioner(s) and myself.  I personally evaluated the patient during the encounter. Briefly, the patient is a 52 y.o. male who presents to the ED with right foot pain, redness.  Patient with tachycardia and low-grade fever upon arrival.  Patient with redness and swelling to the right side of his foot.  Patient has abrasion over the lateral right ankle that is likely nidus for infection.  Patient has redness and swelling of the foot.  He has normal range of motion of the right ankle without any pain.  Patient denies any history of trauma or hardware in the foot ankle.  No recent trauma today.  Patient with x-rays performed that showed some soft tissue swelling of the right foot.  There is no joint effusion.  No concern for septic joint.  Patient likely with cellulitis.  Had no significant leukocytosis.  Lactic acid was mildly elevated and following IV clindamycin, IV fluids lactic acid improved.  Patient had no signs of pneumonia, pneumothorax, pleural effusion on chest x-ray.  Urinalysis is negative for infection.  Blood cultures were collected.  No concern for sepsis at this time.  Patient likely with cellulitis of the foot.  Will discharge on oral clindamycin and recommend follow-up with primary care doctor and told to return to the ED if symptoms worsen after 48 hrs.  Patient is not a diabetic and is not immunocompromised.  He should respond well to oral antibiotics.  He understands return precautions.  Discharged from ED in good condition.  This chart was dictated using voice recognition software.  Despite best efforts to proofread,  errors can occur which can change the documentation meaning.    EKG Interpretation None           Virgina Norfolk, DO 06/14/18 2237

## 2018-06-14 NOTE — ED Notes (Signed)
Bed: UJ81 Expected date:  Expected time:  Means of arrival:  Comments: triage 7

## 2018-06-14 NOTE — ED Notes (Signed)
Patient transported to X-ray 

## 2018-06-14 NOTE — ED Triage Notes (Signed)
Patient c/o right foot pain and pain is worse with weight bearing x 2-3 days. Patient reports a history of peripheral neuropathy.

## 2018-06-18 LAB — I-STAT CG4 LACTIC ACID, ED: LACTIC ACID, VENOUS: 1.72 mmol/L (ref 0.5–1.9)

## 2018-06-19 LAB — CULTURE, BLOOD (ROUTINE X 2)
Culture: NO GROWTH
Special Requests: ADEQUATE

## 2019-03-25 IMAGING — DX DG ANKLE COMPLETE 3+V*R*
3 series · 3 of 3 positions shown · non-contrast
Comparison: None.

CLINICAL DATA: Right foot pain

EXAM:
RIGHT ANKLE - COMPLETE 3+ VIEW

[ankle ap]
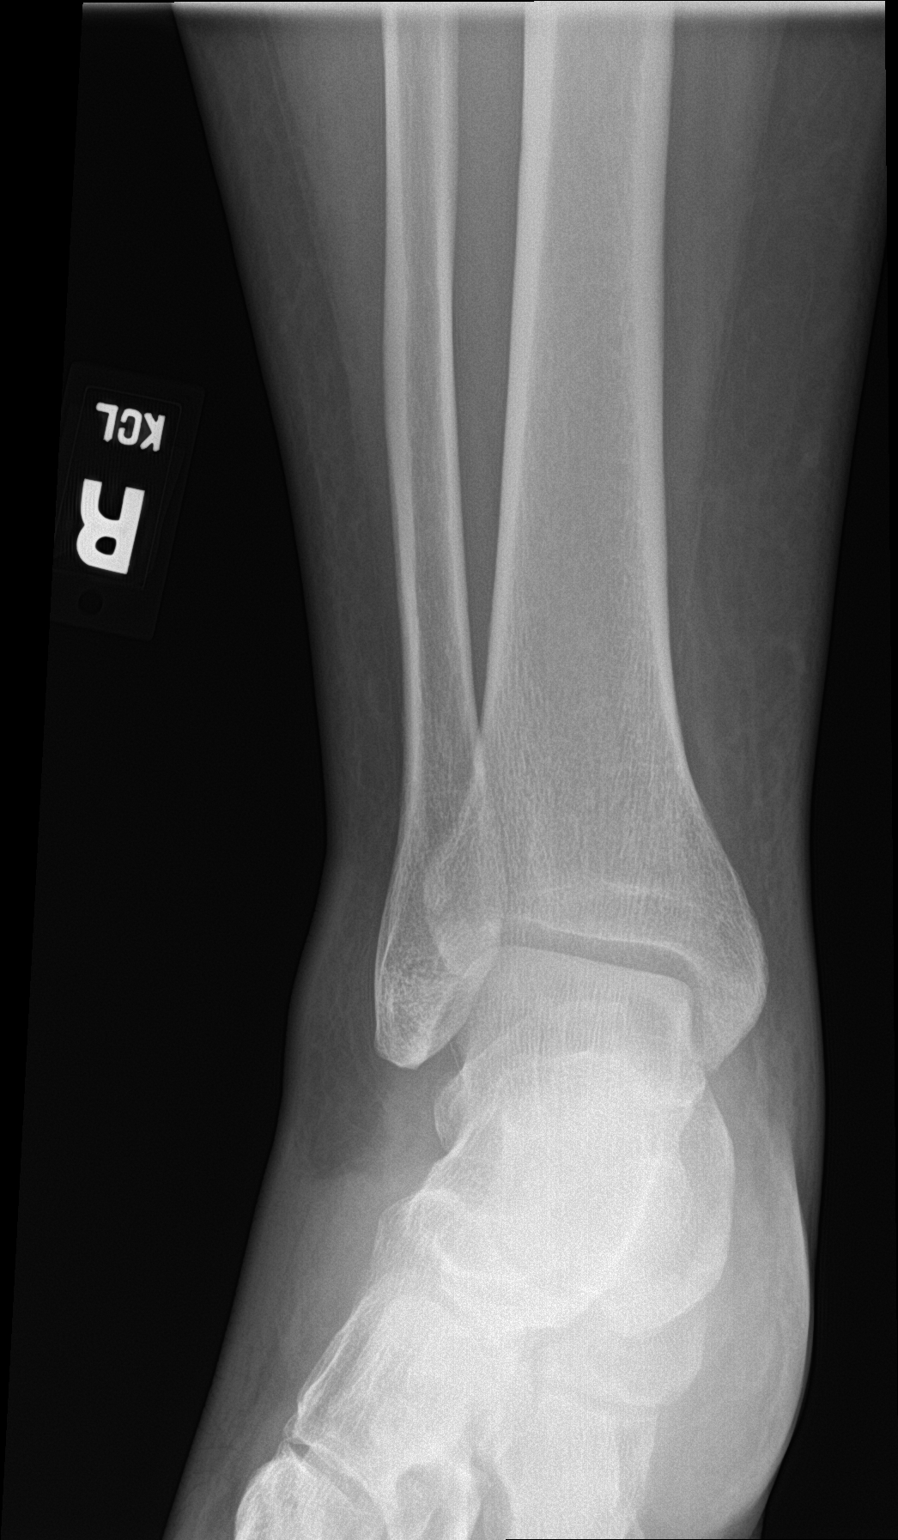

[ankle obl]
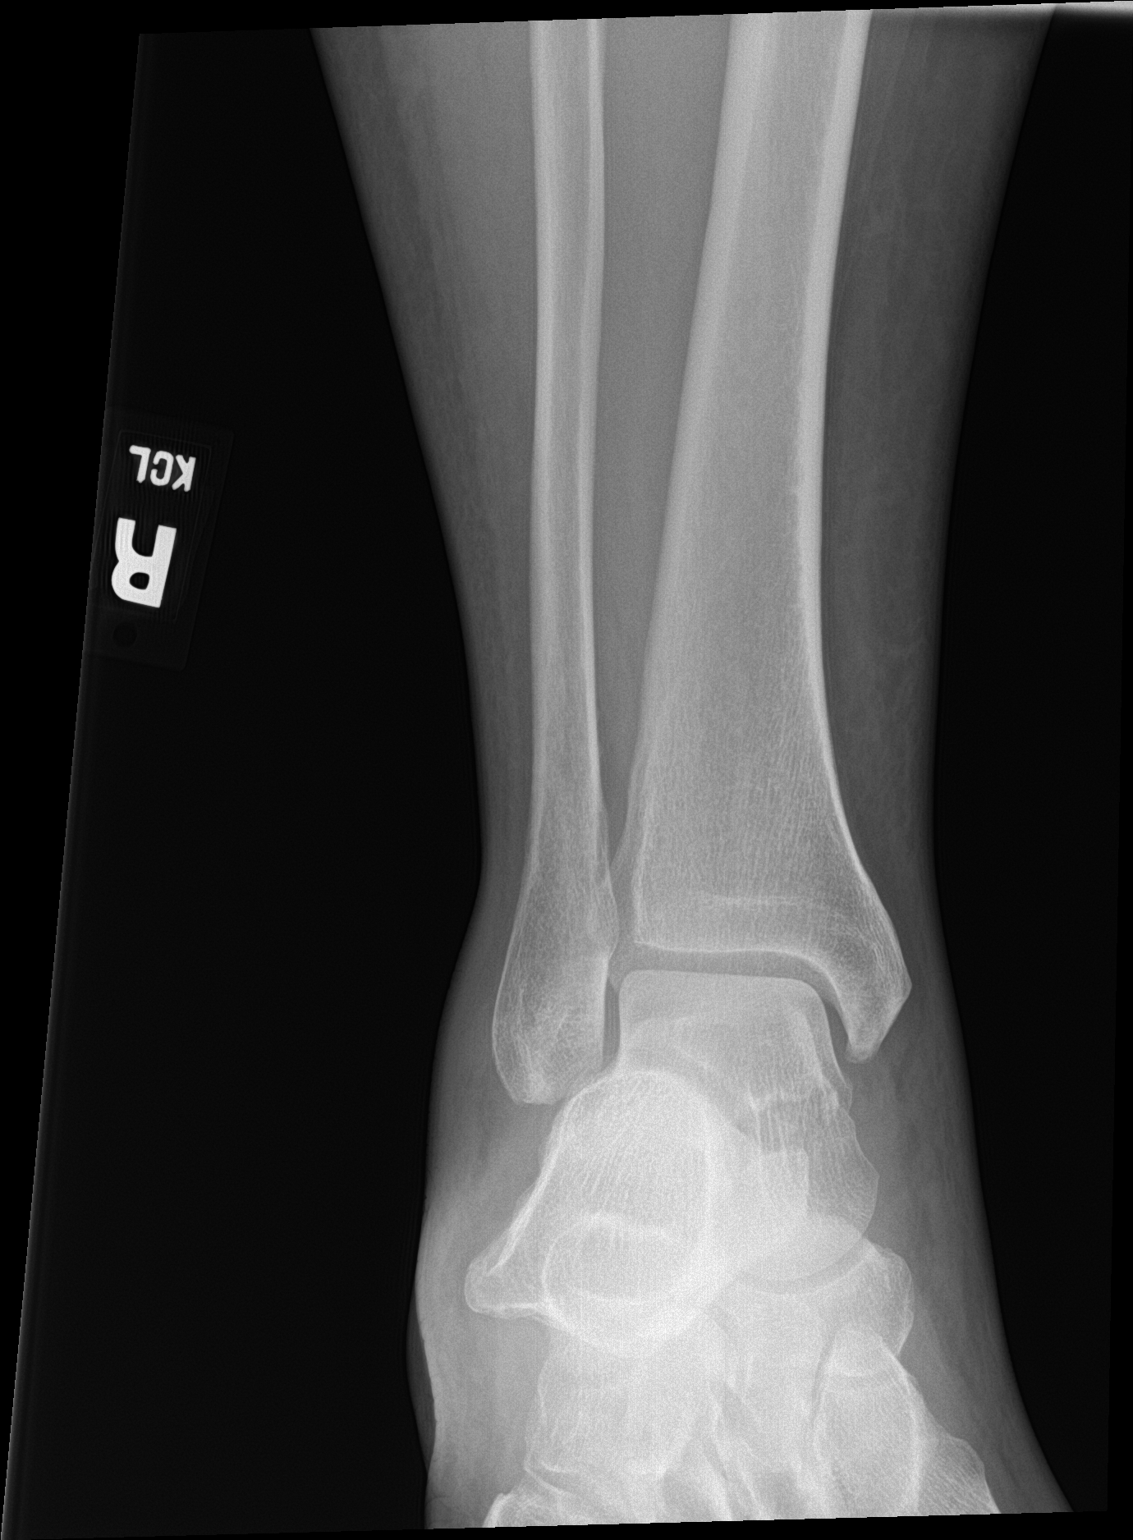

[ankle lat]
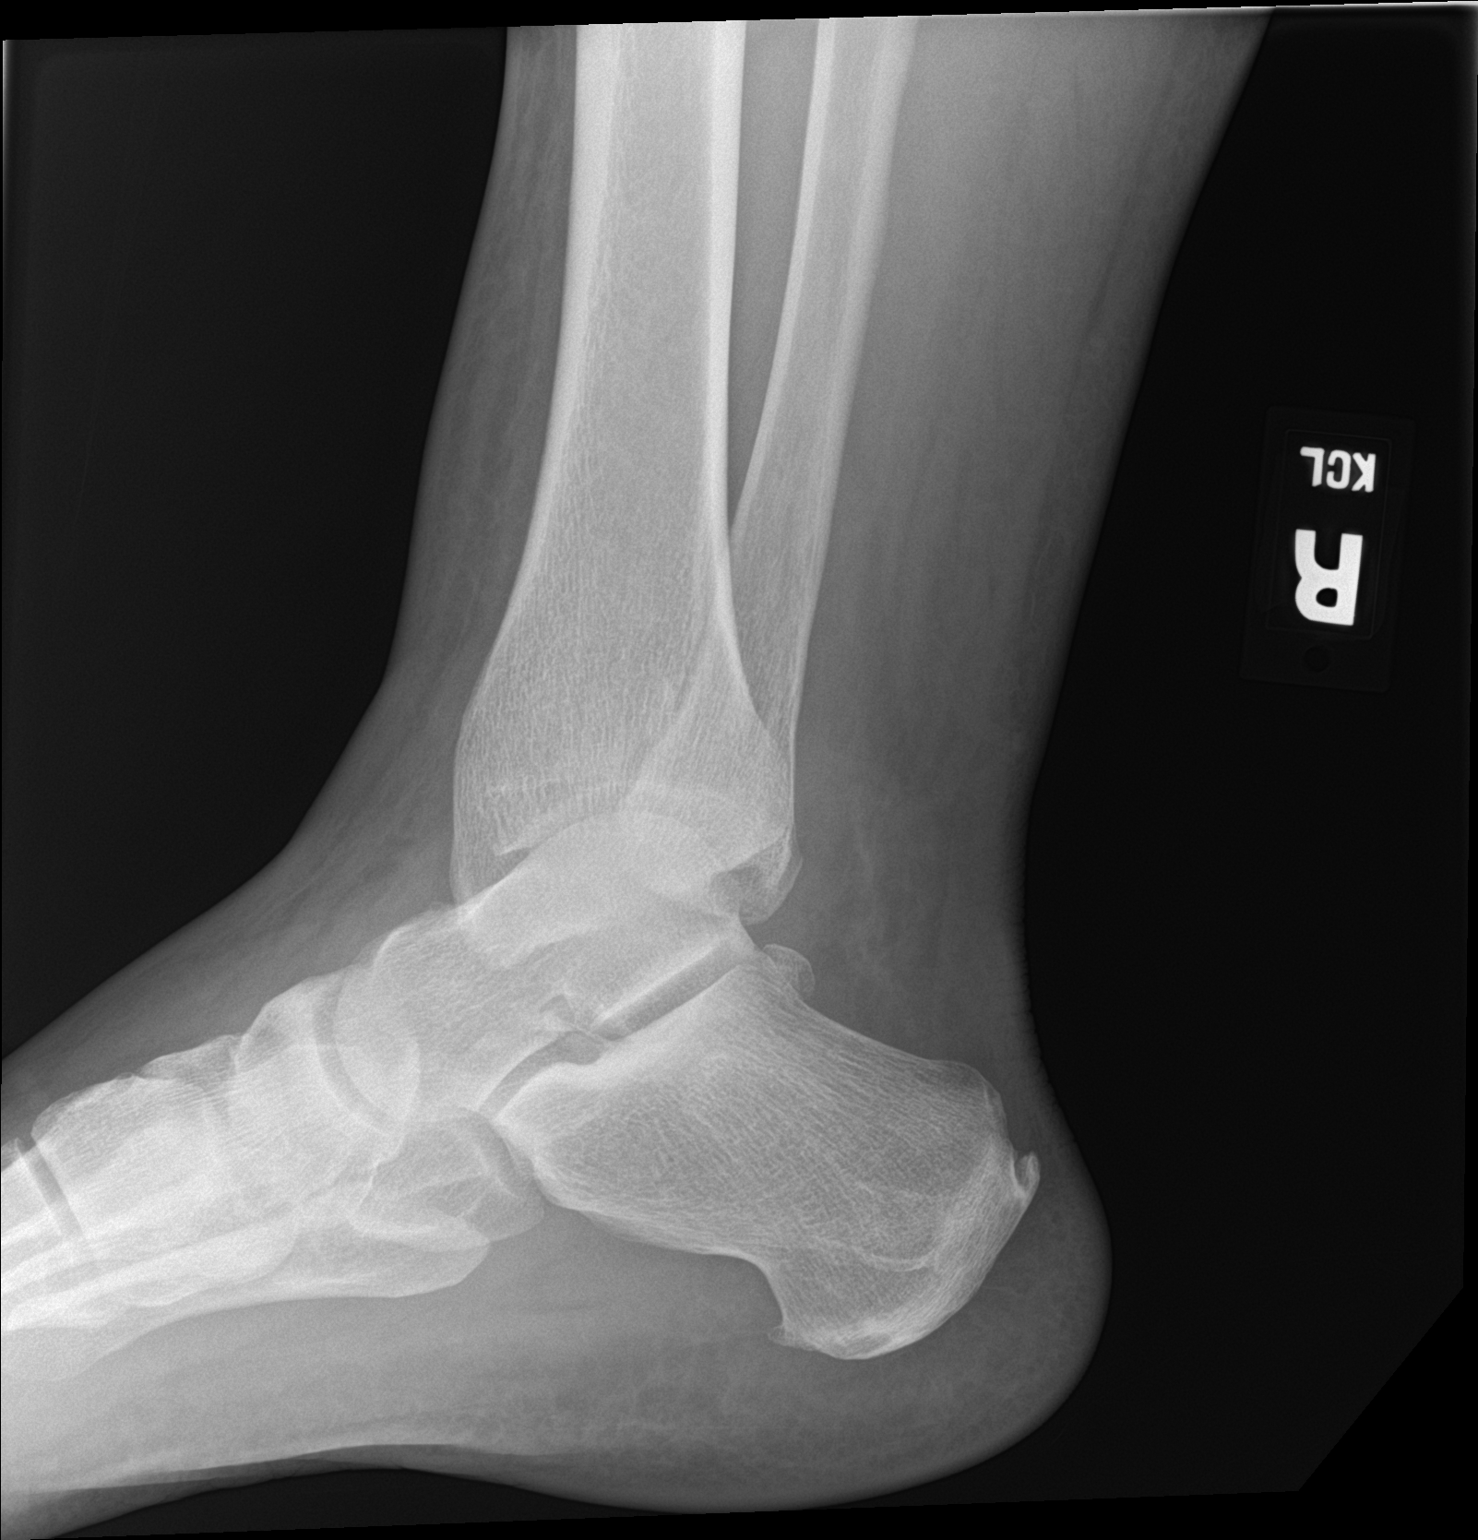

[3 of 3 positions shown; findings below may reference images not displayed]

FINDINGS: There is no evidence of fracture, dislocation, or joint effusion.
There is no evidence of arthropathy or other focal bone abnormality.
Moderate soft tissue swelling..
IMPRESSION: Moderate right ankle soft tissue swelling without acute fracture or
dislocation.

## 2019-12-04 ENCOUNTER — Emergency Department (HOSPITAL_COMMUNITY)
Admission: EM | Admit: 2019-12-04 | Discharge: 2019-12-04 | Disposition: A | Discharge status: Home / Self Care | Payer: Medicare Other | Attending: Emergency Medicine | Admitting: Emergency Medicine

## 2019-12-04 ENCOUNTER — Emergency Department (HOSPITAL_BASED_OUTPATIENT_CLINIC_OR_DEPARTMENT_OTHER): Payer: Medicare Other

## 2019-12-04 ENCOUNTER — Other Ambulatory Visit: Payer: Self-pay

## 2019-12-04 DIAGNOSIS — Z87891 Personal history of nicotine dependence: Secondary | ICD-10-CM | POA: Diagnosis not present

## 2019-12-04 DIAGNOSIS — M7989 Other specified soft tissue disorders: Secondary | ICD-10-CM

## 2019-12-04 DIAGNOSIS — I824Y2 Acute embolism and thrombosis of unspecified deep veins of left proximal lower extremity: Secondary | ICD-10-CM | POA: Insufficient documentation

## 2019-12-04 DIAGNOSIS — Z79899 Other long term (current) drug therapy: Secondary | ICD-10-CM | POA: Diagnosis not present

## 2019-12-04 DIAGNOSIS — M79605 Pain in left leg: Secondary | ICD-10-CM | POA: Diagnosis present

## 2019-12-04 LAB — CBC WITH DIFFERENTIAL/PLATELET
Abs Immature Granulocytes: 0.04 10*3/uL (ref 0.00–0.07)
Basophils Absolute: 0.1 10*3/uL (ref 0.0–0.1)
Basophils Relative: 1 %
Eosinophils Absolute: 0.4 10*3/uL (ref 0.0–0.5)
Eosinophils Relative: 4 %
HCT: 40.4 % (ref 39.0–52.0)
Hemoglobin: 12.4 g/dL — ABNORMAL LOW (ref 13.0–17.0)
Immature Granulocytes: 0 %
Lymphocytes Relative: 15 %
Lymphs Abs: 1.4 10*3/uL (ref 0.7–4.0)
MCH: 26.8 pg (ref 26.0–34.0)
MCHC: 30.7 g/dL (ref 30.0–36.0)
MCV: 87.3 fL (ref 80.0–100.0)
Monocytes Absolute: 0.7 10*3/uL (ref 0.1–1.0)
Monocytes Relative: 8 %
Neutro Abs: 6.7 10*3/uL (ref 1.7–7.7)
Neutrophils Relative %: 72 %
Platelets: 325 10*3/uL (ref 150–400)
RBC: 4.63 MIL/uL (ref 4.22–5.81)
RDW: 18.6 % — ABNORMAL HIGH (ref 11.5–15.5)
WBC: 9.3 10*3/uL (ref 4.0–10.5)
nRBC: 0.3 % — ABNORMAL HIGH (ref 0.0–0.2)

## 2019-12-04 LAB — BASIC METABOLIC PANEL
Anion gap: 12 (ref 5–15)
BUN: 10 mg/dL (ref 6–20)
CO2: 25 mmol/L (ref 22–32)
Calcium: 8.8 mg/dL — ABNORMAL LOW (ref 8.9–10.3)
Chloride: 97 mmol/L — ABNORMAL LOW (ref 98–111)
Creatinine, Ser: 1.08 mg/dL (ref 0.61–1.24)
GFR calc Af Amer: >60 mL/min (ref >60)
GFR calc non Af Amer: >60 mL/min (ref >60)
Glucose, Bld: 118 mg/dL — ABNORMAL HIGH (ref 70–99)
Potassium: 3.4 mmol/L — ABNORMAL LOW (ref 3.5–5.1)
Sodium: 134 mmol/L — ABNORMAL LOW (ref 135–145)

## 2019-12-04 MED ORDER — HYDROCODONE-ACETAMINOPHEN 5-325 MG PO TABS: 1.0000 | ORAL_TABLET | Freq: Four times a day (QID) | ORAL | 0 refills | Status: DC | PRN

## 2019-12-04 MED ORDER — RIVAROXABAN (XARELTO) VTE STARTER PACK (15 & 20 MG): ORAL_TABLET | ORAL | 0 refills | Status: AC

## 2019-12-04 MED ORDER — RIVAROXABAN (XARELTO) EDUCATION KIT FOR DVT/PE PATIENTS
PACK | Freq: Once | Status: AC
  Filled 2019-12-04: qty 1

## 2019-12-04 MED ORDER — HYDROCODONE-ACETAMINOPHEN 5-325 MG PO TABS
1.0000 | ORAL_TABLET | Freq: Once | ORAL | Status: AC
  Administered 2019-12-04: 1 via ORAL
  Filled 2019-12-04: qty 1

## 2019-12-04 MED ORDER — RIVAROXABAN 15 MG PO TABS
15.0000 mg | ORAL_TABLET | Freq: Once | ORAL | Status: AC
  Administered 2019-12-04: 18:00:00 15 mg via ORAL
  Filled 2019-12-04: qty 1

## 2019-12-04 NOTE — ED Provider Notes (Signed)
[place] DEPT Provider Note   CSN: [id number] Arrival date & time: 12/04/19  1445     History Chief Complaint  Patient presents with  . Leg Pain    Kevin Reed is a 54 y.o. male.  The history is provided by the patient.  Leg Pain Location:  Leg Injury: no   Leg location:  L leg Pain details:    Quality:  Aching   Radiates to:  Does not radiate   Severity:  Mild   Onset quality:  Gradual   Timing:  Intermittent   Progression:  Waxing and waning Chronicity:  New Relieved by:  Nothing Worsened by:  Bearing weight Associated symptoms: no back pain, no decreased ROM, no fatigue, no fever, no itching, no muscle weakness, no neck pain, no numbness, no stiffness, no swelling and no tingling        Past Medical History:  Diagnosis Date  . Allergy   . Anemia   . Anxiety   . CTS (carpal tunnel syndrome)   . Depression   . Depression   . Fibromyalgia   . Migraine   . Narcolepsy   . Neuromuscular disorder [facility])     Patient Active Problem List   Diagnosis Date Noted  . Hypersomnia, periodic 12/17/2012  . Depressive disorder, not elsewhere classified 09/28/2012  . Unspecified hereditary and idiopathic peripheral neuropathy 09/26/2012  . Carpal tunnel syndrome on both sides 10/14/2011  . Subacromial or subdeltoid bursitis 10/14/2011  . Myalgia and myositis, unspecified 10/14/2011  . OTHER CHRONIC PAIN 06/28/2010  . NARCOLEPSY WITH CATAPLEXY 06/28/2010  . [place], PERSISTENT 05/18/2010  . SLEEP APNEA 05/18/2010    Past Surgical History:  Procedure Laterality Date  . CARPAL TUNNEL RELEASE  10/2011  . GASTRIC BYPASS  7408   with complications       Family History  Problem Relation Age of Onset  . Cancer Father 52       colon and bladder cancer  . Diabetes Father   . Prostate cancer Father   . Prostate cancer Brother   . Macular degeneration Mother     Social History   Tobacco Use  . Smoking status: Former Smoker     Packs/day: 0.30    Years: 30.00    Pack years: 9.00    Types: Cigarettes  . Smokeless tobacco: Never Used  . Tobacco comment: 1/2 pack daily  Substance Use Topics  . Alcohol use: Yes    Comment: socially  . Drug use: No    Home Medications Prior to Admission medications   Medication Sig Start Date End Date Taking? Authorizing Provider  amLODipine (NORVASC) 10 MG tablet Take 10 mg by mouth daily.  03/16/18   [provider]  chlordiazePOXIDE (LIBRIUM) 25 MG capsule Take 25 mg by mouth 2 (two) times daily. 05/21/18   [provider]  cyanocobalamin (,VITAMIN B-12,) 1000 MCG/ML injection Inject 1,000 mcg into the muscle every 30 (thirty) days.    [provider]  diazepam (VALIUM) 10 MG tablet Take 1 tablet (10 mg total) by mouth at bedtime. For muscle spasm/sleep/anxiety Patient not taking: Reported on 06/14/2018 09/28/12   Elvin So, MD  gabapentin (NEURONTIN) 600 MG tablet Take 600 mg by mouth at bedtime.  03/29/18   [provider]  hydrochlorothiazide (HYDRODIURIL) 25 MG tablet Take 25 mg by mouth daily. 03/11/18   [provider]  HYDROcodone-acetaminophen (NORCO/VICODIN) 5-325 MG tablet Take 1 tablet by mouth every 6 (six) hours as needed  for up to 15 doses. 12/04/19   Isaah Furry, DO  RIVAROXABAN (XARELTO) VTE STARTER PACK (15 & 20 MG TABLETS) Follow package directions: Take one 23m tablet by mouth twice a day. On day 22, switch to one 265mtablet once a day. Take with food. 12/04/19   Alitzel Cookson, DO    Allergies    Lamictal [lamotrigine]  Review of Systems   Review of Systems  Constitutional: Negative for chills, fatigue and fever.  HENT: Negative for ear pain and sore throat.   Eyes: Negative for pain and visual disturbance.  Respiratory: Negative for cough and shortness of breath.   Cardiovascular: Negative for chest pain and palpitations.  Gastrointestinal: Negative for abdominal pain and vomiting.  Genitourinary:  Negative for dysuria and hematuria.  Musculoskeletal: Negative for arthralgias, back pain, neck pain and stiffness.  Skin: Negative for color change, itching and rash.  Neurological: Negative for seizures and syncope.  All other systems reviewed and are negative.   Physical Exam Updated Vital Signs  ED Triage Vitals  Enc Vitals Group     BP 12/04/19 1452 (!) 137/92     Pulse Rate 12/04/19 1452 100     Resp 12/04/19 1452 18     Temp 12/04/19 1452 99.2 F (37.3 C)     Temp Source 12/04/19 1452 Oral     SpO2 12/04/19 1452 96 %     Weight 12/04/19 1453 275 lb (124.7 kg)     Height 12/04/19 1453 _0  (1.778 m)     Head Circumference --      Peak Flow --      Pain Score 12/04/19 1452 4     Pain Loc --      Pain Edu? --      [place]     Physical Exam Vitals and nursing note reviewed.  Constitutional:      General: He is not in acute distress.    Appearance: He is well-developed. He is not ill-appearing.  HENT:     Head: Normocephalic and atraumatic.  Eyes:     Conjunctiva/sclera: Conjunctivae normal.  Cardiovascular:     Rate and Rhythm: Normal rate and regular rhythm.     Pulses: Normal pulses.     Heart sounds: No murmur.  Pulmonary:     Effort: Pulmonary effort is normal. No respiratory distress.     Breath sounds: Normal breath sounds.  Abdominal:     Palpations: Abdomen is soft.     Tenderness: There is no abdominal tenderness.  Musculoskeletal:        General: Tenderness present.     Cervical back: Normal range of motion and neck supple.     Comments: Tenderness to left calf, left popliteal space, normal range of motion at the left knee with no swelling or erythema  Skin:    General: Skin is warm and dry.     Capillary Refill: Capillary refill takes less than 2 seconds.  Neurological:     General: No focal deficit present.     Mental Status: He is alert.     Sensory: No sensory deficit.     Motor: No weakness.     ED Results / Procedures /  Treatments   Labs (all labs ordered are listed, but only abnormal results are displayed) Labs Reviewed  CBC WITH DIFFERENTIAL/PLATELET - Abnormal; Notable for the following components:      Result Value   Hemoglobin 12.4 (*)    RDW 18.6 (*)  nRBC 0.3 (*)    All other components within normal limits  BASIC METABOLIC PANEL - Abnormal; Notable for the following components:   Sodium 134 (*)    Potassium 3.4 (*)    Chloride 97 (*)    Glucose, Bld 118 (*)    Calcium 8.8 (*)    All other components within normal limits    EKG None  Radiology VAS [place] LOWER EXTREMITY VENOUS (DVT) (ONLY [facility] & [facility])  Result Date: 12/04/2019  Lower Venous DVTStudy Indications: Swelling.  Limitations: Poor ultrasound/tissue interface and body habitus. Performing Technologist: June Leap RDMS, RVT  Examination Guidelines: A complete evaluation includes B-mode imaging, spectral Doppler, color Doppler, and power Doppler as needed of all accessible portions of each vessel. Bilateral testing is considered an integral part of a complete examination. Limited examinations for reoccurring indications may be performed as noted. The reflux portion of the exam is performed with the patient in reverse Trendelenburg.  +---------+---------------+---------+-----------+----------+--------------+ LEFT     CompressibilityPhasicitySpontaneityPropertiesThrombus Aging +---------+---------------+---------+-----------+----------+--------------+ CFV      Full           Yes      Yes                                 +---------+---------------+---------+-----------+----------+--------------+ SFJ      Full                                                        +---------+---------------+---------+-----------+----------+--------------+ FV Prox  None                                                        +---------+---------------+---------+-----------+----------+--------------+ FV Mid   None                                                         +---------+---------------+---------+-----------+----------+--------------+ FV DistalNone                                                        +---------+---------------+---------+-----------+----------+--------------+ POP      None           No       No                                  +---------+---------------+---------+-----------+----------+--------------+ PTV      None                                                        +---------+---------------+---------+-----------+----------+--------------+ PERO  None                                                        +---------+---------------+---------+-----------+----------+--------------+     Summary: LEFT: - Findings consistent with acute deep vein thrombosis involving the left femoral vein, left popliteal vein, left posterior tibial veins, and left peroneal veins.  *See table(s) above for measurements and observations.    Preliminary     Procedures Procedures (including critical care time)  Medications Ordered in ED Medications  rivaroxaban (XARELTO) Education Kit for DVT/PE patients (has no administration in time range)  Rivaroxaban (XARELTO) tablet 15 mg (has no administration in time range)  HYDROcodone-acetaminophen (NORCO/VICODIN) 5-325 MG per tablet 1 tablet (1 tablet Oral Given 12/04/19 1659)    ED Course  I have reviewed the triage vital signs and the nursing notes.  Pertinent labs & imaging results that were available during my care of the patient were reviewed by me and considered in my medical decision making (see chart for details).    MDM Rules/Calculators/A&P                      Kevin Reed is a 54 year old male with history of depression, fibromyalgia who presents to the ED with left calf pain.  Sent by PCP to rule out DVT.  Patient with good bilateral pulses in his lower legs.  Good strength and sensation in his lower legs.  Has some tenderness to the left  calf, left popliteal area.  No obvious edema.  Suspect muscle pain versus DVT versus Baker's cyst.  Doubt arterial process given good pulses.  Patient with left lower leg DVT.  Discussed with Dr. Donnetta Hutching with vascular surgery and given that clot does not involve the common femoral okay for regular anticoagulation.  Blood work was unremarkable.  Pharmacy was consulted to educate the patient on Xarelto which we will use for DVT treatment.  Will prescribe Norco for pain control.  We will have him follow-up with primary care doctor.  Given information to follow-up with hematology if needed.  This chart was dictated using voice recognition software.  Despite best efforts to proofread,  errors can occur which can change the documentation meaning.    Final Clinical Impression(s) / ED Diagnoses Final diagnoses:  Acute deep vein thrombosis (DVT) of proximal vein of left lower extremity ([place])    Rx / DC Orders ED Discharge Orders         Ordered    RIVAROXABAN (XARELTO) VTE STARTER PACK (15 & 20 MG TABLETS)     12/04/19 1730    HYDROcodone-acetaminophen (NORCO/VICODIN) 5-325 MG tablet  Every 6 hours PRN     12/04/19 1730           Lennice Sites, DO 12/04/19 1736

## 2019-12-04 NOTE — Progress Notes (Signed)
ANTICOAGULATION CONSULT NOTE - Initial Consult  Pharmacy Consult for Xarelto Indication: pulmonary embolus  Allergies  Allergen Reactions  . Lamictal [Lamotrigine] Rash    Patient Measurements: Height: 5\' 10"  (177.8 cm) Weight: 124.7 kg (275 lb) IBW/kg (Calculated) : 73 Heparin Dosing Weight:   Vital Signs: Temp: 99.2 F (37.3 C) (04/21 1452) Temp Source: Oral (04/21 1452) BP: 137/92 (04/21 1452) Pulse Rate: 100 (04/21 1452)  Labs: Recent Labs    12/04/19 1700  HGB 12.4*  HCT 40.4  PLT 325  CREATININE 1.08    Estimated Creatinine Clearance: 106 mL/min (by C-G formula based on SCr of 1.08 mg/dL).   Medical History: Past Medical History:  Diagnosis Date  . Allergy   . Anemia   . Anxiety   . CTS (carpal tunnel syndrome)   . Depression   . Depression   . Fibromyalgia   . Migraine   . Narcolepsy   . Neuromuscular disorder [facility])     Assessment: 54 yo M with new DVT  Plan:  Xarelto 15 mg po BID x 21 days then 20 mg qsupper Will educate pt and give 30 day free card Will give first dose in ED prior to discharge  44, Pharm.D [phone] 12/04/2019 5:32 PM

## 2019-12-04 NOTE — Progress Notes (Signed)
Lower venous duplex       has been completed. Preliminary results can be found under CV proc through chart review. Ashdon Gillson, BS, RDMS, RVT   

## 2019-12-04 NOTE — ED Triage Notes (Signed)
Pt presents with c/o left leg pain that started on Friday. Pt went to his PCP today and was told to come here for evaluation for a blood clot. Pt reports the leg is painful with ambulation and bending.

## 2020-01-09 ENCOUNTER — Encounter: Payer: Self-pay | Admitting: Gastroenterology

## 2020-01-10 ENCOUNTER — Encounter: Payer: Self-pay | Admitting: Gastroenterology

## 2020-02-19 ENCOUNTER — Ambulatory Visit (INDEPENDENT_AMBULATORY_CARE_PROVIDER_SITE_OTHER): Payer: Medicare Other | Admitting: Gastroenterology

## 2020-02-19 ENCOUNTER — Encounter: Payer: Self-pay | Admitting: Gastroenterology

## 2020-02-19 VITALS — BP 128/74 | HR 87 | Ht 70.0 in | Wt 277.0 lb

## 2020-02-19 DIAGNOSIS — R195 Other fecal abnormalities: Secondary | ICD-10-CM

## 2020-02-19 NOTE — Patient Instructions (Signed)
If you are age 53 or older, your body mass index should be between 23-30. Your Body mass index is 39.75 kg/m. If this is out of the aforementioned range listed, please consider follow up with your Primary Care Provider.  If you are age 40 or younger, your body mass index should be between 19-25. Your Body mass index is 39.75 kg/m. If this is out of the aformentioned range listed, please consider follow up with your Primary Care Provider.   Please contact our office after your hematology appointment.  They will decide when your blood thinner can be held for colonoscopy.  Thank you for entrusting me with your care and choosing [facility].  Dr Christella Hartigan

## 2020-02-19 NOTE — Progress Notes (Signed)
HPI: This is a very pleasant 54 year old man who was referred to me by Macy Mis, MD  to evaluate Hemoccult positive stool.    He explained to me that he recently did home stool testing by his primary care physician and was found to have Hemoccult positive stool.  He never sees blood in his stool.  He really has no issues with his bowels.  No serious constipation or diarrhea or significant abdominal pains.  His father did have colon cancer diagnosed in his 75s and he survived after what sounds like a segmental colectomy.  He was recently diagnosed with a left leg DVT shortly after Covid vaccination.  He is on his second month of Xarelto.  He believes he might be stopping the Xarelto after another month.  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  He has had colonoscopy before, the last was about 10 or 12 years ago.  It was completely normal.  This was done in [place].  Old Data Reviewed:  Blood work April 2021 show hemoglobin 12.4 otherwise normal CBC, potassium 3.4, normal creatinine.   Review of systems: Pertinent positive and negative review of systems were noted in the above HPI section. All other review negative.   Past Medical History:  Diagnosis Date  . Allergy   . Anemia   . Anxiety   . CTS (carpal tunnel syndrome)   . Depression   . Depression   . Fibromyalgia   . Migraine   . Narcolepsy   . Neuromuscular disorder [facility])     Past Surgical History:  Procedure Laterality Date  . CARPAL TUNNEL RELEASE  10/2011  . GASTRIC BYPASS  2001   with complications    Current Outpatient Medications  Medication Sig Dispense Refill  . amLODipine (NORVASC) 10 MG tablet Take 10 mg by mouth daily.   1  . chlordiazePOXIDE (LIBRIUM) 25 MG capsule Take 25 mg by mouth 2 (two) times daily.  5  . cyanocobalamin (,VITAMIN B-12,)  1000 MCG/ML injection Inject 1,000 mcg into the muscle every 30 (thirty) days.    Marland Kitchen gabapentin (NEURONTIN) 600 MG tablet Take 600 mg by mouth at bedtime.   12  . guanFACINE (TENEX) 2 MG tablet Take 2 mg by mouth in the morning.    . hydrochlorothiazide (HYDRODIURIL) 25 MG tablet Take 25 mg by mouth daily.  5  . RIVAROXABAN (XARELTO) VTE STARTER PACK (15 & 20 MG TABLETS) Follow package directions: Take one 15mg  tablet by mouth twice a day. On day 22, switch to one 20mg  tablet once a day. Take with food. 51 each 0   No current facility-administered medications for this visit.    Allergies as of 02/19/2020 - Review Complete 02/19/2020  Allergen Reaction Noted  . Hydrocodone  02/19/2020  . Lamictal [lamotrigine] Rash 10/14/2011    Family History  Problem Relation Age of Onset  . Cancer Father 37       colon and bladder cancer  . Diabetes Father   . Prostate cancer Father   . Prostate cancer Brother   . Macular degeneration Mother     Social History   Socioeconomic History  . Marital status: Single    Spouse name: Not on file  . Number of children: 1  . Years of education: college  . Highest education level: Not on file  Occupational History  .  Occupation: disabled    Associate Professor: UNEMPLOYED  Tobacco Use  . Smoking status: Former Smoker    Packs/day: 0.30    Years: 30.00    Pack years: 9.00    Types: Cigarettes  . Smokeless tobacco: Never Used  . Tobacco comment: 1/2 pack daily  Vaping Use  . Vaping Use: Never used  Substance and Sexual Activity  . Alcohol use: Yes    Comment: socially  . Drug use: No  . Sexual activity: Not Currently  Other Topics Concern  . Not on file  Social History Narrative    The patient is separated, has one child, is disabled, resides in a home with his  mother, has some college.  He denies any drug use, alcohol socially, smokes 1/2 pack daily, consumes a lot of caffeine daily.   Social Determinants of Health   Financial Resource Strain:    . Difficulty of Paying Living Expenses:   Food Insecurity:   . Worried About Programme researcher, broadcasting/film/video in the Last Year:   . Barista in the Last Year:   Transportation Needs:   . Freight forwarder (Medical):   Marland Kitchen Lack of Transportation (Non-Medical):   Physical Activity:   . Days of Exercise per Week:   . Minutes of Exercise per Session:   Stress:   . Feeling of Stress :   Social Connections:   . Frequency of Communication with Friends and Family:   . Frequency of Social Gatherings with Friends and Family:   . Attends Religious Services:   . Active Member of Clubs or Organizations:   . Attends Banker Meetings:   Marland Kitchen Marital Status:   Intimate Partner Violence:   . Fear of Current or Ex-Partner:   . Emotionally Abused:   Marland Kitchen Physically Abused:   . Sexually Abused:      Physical Exam: Ht 5\' 10"  (1.778 m)   Wt 277 lb (125.6 kg)   BMI 39.75 kg/m  Constitutional: generally well-appearing Psychiatric: alert and oriented x3 Eyes: extraocular movements intact Mouth: oral pharynx moist, no lesions Neck: supple no lymphadenopathy Cardiovascular: heart regular rate and rhythm Lungs: clear to auscultation bilaterally Abdomen: soft, nontender, nondistended, no obvious ascites, no peritoneal signs, normal bowel sounds Extremities: no lower extremity edema bilaterally Skin: no lesions on visible extremities   Assessment and plan: 54 y.o. male with Hemoccult positive stool, mild normocytic anemia  I recommended colonoscopy at his soonest convenience.  He is currently on Xarelto for an acute DVT.  He is planning to meet with his hematologist about possibly stopping the Xarelto after another month or so.  He will call back to let 44 know what they decide.  After hearing back from him we will plan on colonoscopy, holding Xarelto 2 days if he still needs to be on it.  Please see the "Patient Instructions" section for addition details about the plan.   [place],  MD [facility] 02/19/2020, 2:18 PM  Cc: 04/21/2020, MD  Total time on date of encounter was 45  minutes (this included time spent preparing to see the patient reviewing records; obtaining and/or reviewing separately obtained history; performing a medically appropriate exam and/or evaluation; counseling and educating the patient and family if present; ordering medications, tests or procedures if applicable; and documenting clinical information in the health record).

## 2020-04-08 ENCOUNTER — Telehealth: Payer: Self-pay | Admitting: Gastroenterology

## 2020-04-08 NOTE — Telephone Encounter (Signed)
Spoke with patient, he states that he is still taking Xarelto, I told him that we had not heard anything from his hematologist. He states that he sees Dr. Neil Crouch at [facility] in [place], phone: [phone], fax: [phone]. I have faxed a request for advice for when to hold Xarelto prior to colonoscopy. Will await response

## 2020-04-09 NOTE — Telephone Encounter (Signed)
Received ok to hold Xarelto 2 days prior to procedure and to resume 24-48 hours following procedure. A copy of this documentation has been given to pre visit nurse, a copy has also been sent to be scanned into patient's chart.

## 2020-04-22 ENCOUNTER — Ambulatory Visit (AMBULATORY_SURGERY_CENTER): Payer: Self-pay | Admitting: *Deleted

## 2020-04-22 ENCOUNTER — Other Ambulatory Visit: Payer: Self-pay

## 2020-04-22 VITALS — Ht 70.0 in | Wt 277.0 lb

## 2020-04-22 DIAGNOSIS — R195 Other fecal abnormalities: Secondary | ICD-10-CM

## 2020-04-22 MED ORDER — PEG 3350-KCL-NA BICARB-NACL 420 G PO SOLR: 4000.0000 mL | Freq: Once | ORAL | 0 refills | Status: AC

## 2020-04-22 NOTE — Progress Notes (Signed)

## 2020-04-23 ENCOUNTER — Encounter: Payer: Self-pay | Admitting: Gastroenterology

## 2020-05-06 ENCOUNTER — Other Ambulatory Visit: Payer: Self-pay

## 2020-05-06 ENCOUNTER — Encounter: Payer: Self-pay | Admitting: Gastroenterology

## 2020-05-06 ENCOUNTER — Ambulatory Visit (AMBULATORY_SURGERY_CENTER): Payer: Medicare Other | Admitting: Gastroenterology

## 2020-05-06 VITALS — BP 122/85 | HR 75 | Temp 97.8°F | Resp 11 | Ht 70.0 in | Wt 277.0 lb

## 2020-05-06 DIAGNOSIS — R195 Other fecal abnormalities: Secondary | ICD-10-CM | POA: Diagnosis not present

## 2020-05-06 MED ORDER — SODIUM CHLORIDE 0.9 % IV SOLN: 500.0000 mL | Freq: Once | INTRAVENOUS | Status: DC

## 2020-05-06 NOTE — Progress Notes (Signed)
PT taken to PACU. Monitors in place. VSS. Report given to RN. 

## 2020-05-06 NOTE — Patient Instructions (Signed)
It is OK to resume your Xarelto (rivaroxaban) today.    YOU HAD AN ENDOSCOPIC PROCEDURE TODAY AT THE [facility]:   Refer to the procedure report that was given to you for any specific questions about what was found during the examination.  If the procedure report does not answer your questions, please call your gastroenterologist to clarify.  If you requested that your care partner not be given the details of your procedure findings, then the procedure report has been included in a sealed envelope for you to review at your convenience later.  YOU SHOULD EXPECT: Some feelings of bloating in the abdomen. Passage of more gas than usual.  Walking can help get rid of the air that was put into your GI tract during the procedure and reduce the bloating. If you had a lower endoscopy (such as a colonoscopy or flexible sigmoidoscopy) you may notice spotting of blood in your stool or on the toilet paper. If you underwent a bowel prep for your procedure, you may not have a normal bowel movement for a few days.  Please Note:  You might notice some irritation and congestion in your nose or some drainage.  This is from the oxygen used during your procedure.  There is no need for concern and it should clear up in a day or so.  SYMPTOMS TO REPORT IMMEDIATELY:   Following lower endoscopy (colonoscopy or flexible sigmoidoscopy):  Excessive amounts of blood in the stool  Significant tenderness or worsening of abdominal pains  Swelling of the abdomen that is new, acute  Fever of 100F or higher  For urgent or emergent issues, a gastroenterologist can be reached at any hour by calling [phone]. Do not use MyChart messaging for urgent concerns.    DIET:  We do recommend a small meal at first, but then you may proceed to your regular diet.  Drink plenty of fluids but you should avoid alcoholic beverages for 24 hours.  ACTIVITY:  You should plan to take it easy for the rest of today and you should  NOT DRIVE or use heavy machinery until tomorrow (because of the sedation medicines used during the test).    FOLLOW UP: Our staff will call the number listed on your records 48-72 hours following your procedure to check on you and address any questions or concerns that you may have regarding the information given to you following your procedure. If we do not reach you, we will leave a message.  We will attempt to reach you two times.  During this call, we will ask if you have developed any symptoms of COVID 19. If you develop any symptoms (ie: fever, flu-like symptoms, shortness of breath, cough etc.) before then, please call [phone].  If you test positive for Covid 19 in the 2 weeks post procedure, please call and report this information to [place].    If any biopsies were taken you will be contacted by phone or by letter within the next 1-3 weeks.  Please call us at [phone] if you have not heard about the biopsies in 3 weeks.    SIGNATURES/CONFIDENTIALITY: You and/or your care partner have signed paperwork which will be entered into your electronic medical record.  These signatures attest to the fact that that the information above on your After Visit Summary has been reviewed and is understood.  Full responsibility of the confidentiality of this discharge information lies with you and/or your care-partner.

## 2020-05-06 NOTE — Progress Notes (Signed)
Pt's states no medical or surgical changes since previsit or office visit. 

## 2020-05-06 NOTE — Op Note (Signed)
[facility] Patient Name: Kevin Reed Procedure Date: 05/06/2020 2:25 PM MRN: [id number] Endoscopist: Rachael Fee , MD Age: 54 Referring MD:  Date of Birth: [birth year] Gender: Male Account #: [id number] Procedure:                Colonoscopy Indications:              Heme positive stool Medicines:                Monitored Anesthesia Care Procedure:                Pre-Anesthesia Assessment:                           - Prior to the procedure, a History and Physical                            was performed, and patient medications and                            allergies were reviewed. The patient's tolerance of                            previous anesthesia was also reviewed. The risks                            and benefits of the procedure and the sedation                            options and risks were discussed with the patient.                            All questions were answered, and informed consent                            was obtained. Prior Anticoagulants: The patient has                            taken Xarelto (rivaroxaban), last dose was 3 days                            prior to procedure. ASA Grade Assessment: II - A                            patient with mild systemic disease. After reviewing                            the risks and benefits, the patient was deemed in                            satisfactory condition to undergo the procedure.                           After obtaining informed consent, the colonoscope  was passed under direct vision. Throughout the                            procedure, the patient's blood pressure, pulse, and                            oxygen saturations were monitored continuously. The                            Colonoscope was introduced through the anus and                            advanced to the the cecum, identified by                            appendiceal orifice and ileocecal  valve. The                            colonoscopy was performed without difficulty. The                            patient tolerated the procedure well. The quality                            of the bowel preparation was good. The ileocecal                            valve, appendiceal orifice, and rectum were                            photographed. Scope In: 2:31:17 PM Scope Out: 2:49:07 PM Scope Withdrawal Time: 0 hours 8 minutes 36 seconds  Total Procedure Duration: 0 hours 17 minutes 50 seconds  Findings:                 The entire examined colon appeared normal on direct                            and retroflexion views. Complications:            No immediate complications. Estimated blood loss:                            None. Estimated Blood Loss:     Estimated blood loss: none. Impression:               - The entire examined colon is normal on direct and                            retroflexion views.                           - No polyps or cancers. Recommendation:           - Patient has a contact number available for  emergencies. The signs and symptoms of potential                            delayed complications were discussed with the                            patient. Return to normal activities tomorrow.                            Written discharge instructions were provided to the                            patient.                           - Resume previous diet.                           - Continue present medications. It is OK to resume                            your bloodthinner today.                           - Repeat colonoscopy in 10 years for screening. Rachael Fee, MD 05/06/2020 2:52:02 PM This report has been signed electronically.

## 2020-05-08 ENCOUNTER — Telehealth: Payer: Self-pay | Admitting: *Deleted

## 2020-05-08 NOTE — Telephone Encounter (Signed)
1. Have you developed a fever since your procedure? no  2.   Have you had an respiratory symptoms (SOB or cough) since your procedure? no  3.   Have you tested positive for COVID 19 since your procedure no  4.   Have you had any family members/close contacts diagnosed with the COVID 19 since your procedure?  no   If yes to any of these questions please route to Laverna Peace, RN and Karlton Lemon, RN Follow up Call-  Call back number 05/06/2020  Post procedure Call Back phone  # [phone]  Permission to leave phone message Yes  Some recent data might be hidden     Patient questions:  Do you have a fever, pain , or abdominal swelling? No. Pain Score  0 *  Have you tolerated food without any problems? Yes.    Have you been able to return to your normal activities? Yes.    Do you have any questions about your discharge instructions: Diet   No. Medications  No. Follow up visit  No.  Do you have questions or concerns about your Care? No.  Actions: * If pain score is 4 or above: No action needed, pain <4.

## 2020-12-07 ENCOUNTER — Emergency Department (HOSPITAL_COMMUNITY)
Admission: EM | Admit: 2020-12-07 | Discharge: 2020-12-07 | Disposition: A | Discharge status: Home / Self Care | Payer: Medicare Other | Attending: Emergency Medicine | Admitting: Emergency Medicine

## 2020-12-07 ENCOUNTER — Encounter (HOSPITAL_COMMUNITY): Payer: Self-pay | Admitting: *Deleted

## 2020-12-07 ENCOUNTER — Other Ambulatory Visit: Payer: Self-pay

## 2020-12-07 ENCOUNTER — Emergency Department (HOSPITAL_COMMUNITY): Payer: Medicare Other

## 2020-12-07 ENCOUNTER — Emergency Department (HOSPITAL_BASED_OUTPATIENT_CLINIC_OR_DEPARTMENT_OTHER): Payer: Medicare Other

## 2020-12-07 DIAGNOSIS — L03116 Cellulitis of left lower limb: Secondary | ICD-10-CM | POA: Insufficient documentation

## 2020-12-07 DIAGNOSIS — I10 Essential (primary) hypertension: Secondary | ICD-10-CM | POA: Insufficient documentation

## 2020-12-07 DIAGNOSIS — Z79899 Other long term (current) drug therapy: Secondary | ICD-10-CM | POA: Insufficient documentation

## 2020-12-07 DIAGNOSIS — M79605 Pain in left leg: Secondary | ICD-10-CM

## 2020-12-07 DIAGNOSIS — Z87891 Personal history of nicotine dependence: Secondary | ICD-10-CM | POA: Diagnosis not present

## 2020-12-07 DIAGNOSIS — L538 Other specified erythematous conditions: Secondary | ICD-10-CM | POA: Diagnosis not present

## 2020-12-07 DIAGNOSIS — M7989 Other specified soft tissue disorders: Secondary | ICD-10-CM | POA: Diagnosis not present

## 2020-12-07 DIAGNOSIS — Z7901 Long term (current) use of anticoagulants: Secondary | ICD-10-CM | POA: Diagnosis not present

## 2020-12-07 DIAGNOSIS — M79672 Pain in left foot: Secondary | ICD-10-CM | POA: Diagnosis present

## 2020-12-07 DIAGNOSIS — Z86718 Personal history of other venous thrombosis and embolism: Secondary | ICD-10-CM | POA: Diagnosis not present

## 2020-12-07 LAB — CBC WITH DIFFERENTIAL/PLATELET
Abs Immature Granulocytes: 0.03 10*3/uL (ref 0.00–0.07)
Basophils Absolute: 0.1 10*3/uL (ref 0.0–0.1)
Basophils Relative: 1 %
Eosinophils Absolute: 0.1 10*3/uL (ref 0.0–0.5)
Eosinophils Relative: 2 %
HCT: 49.6 % (ref 39.0–52.0)
Hemoglobin: 17.3 g/dL — ABNORMAL HIGH (ref 13.0–17.0)
Immature Granulocytes: 0 %
Lymphocytes Relative: 20 %
Lymphs Abs: 1.5 10*3/uL (ref 0.7–4.0)
MCH: 34.8 pg — ABNORMAL HIGH (ref 26.0–34.0)
MCHC: 34.9 g/dL (ref 30.0–36.0)
MCV: 99.8 fL (ref 80.0–100.0)
Monocytes Absolute: 0.5 10*3/uL (ref 0.1–1.0)
Monocytes Relative: 7 %
Neutro Abs: 5.4 10*3/uL (ref 1.7–7.7)
Neutrophils Relative %: 70 %
Platelets: 281 10*3/uL (ref 150–400)
RBC: 4.97 MIL/uL (ref 4.22–5.81)
RDW: 13.8 % (ref 11.5–15.5)
WBC: 7.7 10*3/uL (ref 4.0–10.5)
nRBC: 0 % (ref 0.0–0.2)

## 2020-12-07 LAB — COMPREHENSIVE METABOLIC PANEL
ALT: 23 U/L (ref 0–44)
AST: 30 U/L (ref 15–41)
Albumin: 4.3 g/dL (ref 3.5–5.0)
Alkaline Phosphatase: 86 U/L (ref 38–126)
Anion gap: 12 (ref 5–15)
BUN: 9 mg/dL (ref 6–20)
CO2: 29 mmol/L (ref 22–32)
Calcium: 9.5 mg/dL (ref 8.9–10.3)
Chloride: 94 mmol/L — ABNORMAL LOW (ref 98–111)
Creatinine, Ser: 1.28 mg/dL — ABNORMAL HIGH (ref 0.61–1.24)
GFR, Estimated: >60 mL/min (ref >60)
Glucose, Bld: 105 mg/dL — ABNORMAL HIGH (ref 70–99)
Potassium: 3.6 mmol/L (ref 3.5–5.1)
Sodium: 135 mmol/L (ref 135–145)
Total Bilirubin: 0.6 mg/dL (ref 0.3–1.2)
Total Protein: 8.1 g/dL (ref 6.5–8.1)

## 2020-12-07 LAB — LACTIC ACID, PLASMA
Lactic Acid, Venous: 2.4 mmol/L (ref 0.5–1.9)
Lactic Acid, Venous: 1.7 mmol/L (ref 0.5–1.9)

## 2020-12-07 MED ORDER — CLINDAMYCIN PHOSPHATE 600 MG/50ML IV SOLN
600.0000 mg | Freq: Once | INTRAVENOUS | Status: AC
  Administered 2020-12-07: 600 mg via INTRAVENOUS
  Filled 2020-12-07: qty 50

## 2020-12-07 MED ORDER — SODIUM CHLORIDE 0.9 % IV BOLUS
1000.0000 mL | Freq: Once | INTRAVENOUS | Status: AC
  Administered 2020-12-07: 1000 mL via INTRAVENOUS

## 2020-12-07 MED ORDER — CLINDAMYCIN HCL 300 MG PO CAPS: 300.0000 mg | ORAL_CAPSULE | Freq: Four times a day (QID) | ORAL | 0 refills | Status: AC

## 2020-12-07 MED ORDER — KETOROLAC TROMETHAMINE 15 MG/ML IJ SOLN
15.0000 mg | Freq: Once | INTRAMUSCULAR | Status: AC
  Administered 2020-12-07: 15 mg via INTRAVENOUS
  Filled 2020-12-07: qty 1

## 2020-12-07 MED ORDER — HYDROCODONE-ACETAMINOPHEN 5-325 MG PO TABS: 2.0000 | ORAL_TABLET | ORAL | 0 refills | Status: AC | PRN

## 2020-12-07 NOTE — ED Triage Notes (Addendum)
Emergency Medicine Provider Triage Evaluation Note  Kevin Reed , a 55 y.o. male  was evaluated in triage.  Pt complains of left foot pain x waking up this morning, increased swelling. Has taken advil without improvement. Worse with ambulation and sleeping at night. No fever, no injury, no prior of MRSA, no prior hx of gout.   Review of Systems  Positive: Foot pain, swelling  Negative: Fever,   Physical Exam  BP (!) 141/96 (BP Location: Left Arm)   Pulse (!) 108   Temp 98.4 F (36.9 C) (Oral)   Resp 20   Wt 119.7 kg   SpO2 97%   BMI 37.88 kg/m  Gen:   Awake, no distress   HEENT:  Atraumatic Resp:  Normal effort  Cardiac:  Normal rate  Abd:   Nondistended, nontender  MSK:   Moves extremities without difficulty, left foot tender along medial aspect including left great toe. Pulses present, 1+ edema, no calf tenderness.  Neuro:  Speech clear   Medical Decision Making  Medically screening exam initiated at 5:10 PM.  Appropriate orders placed.  Kevin Reed was informed that the remainder of the evaluation will be completed by another provider, this initial triage assessment does not replace that evaluation, and the importance of remaining in the ED until their evaluation is complete.  Clinical Impression   Patient with leg foot pain and swelling, no recent abx, no gout or MRSA. 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 Not on any blood thinner.   [name], PA-C 12/07/20 1714    [name], PA-C 12/07/20 [phone]

## 2020-12-07 NOTE — ED Triage Notes (Signed)
Left foot pain and redness and swelling which has been increasing since Friday.  Pt has hx cellulitis and this feels the same.  No fever or chills.

## 2020-12-07 NOTE — Progress Notes (Signed)
Lower extremity venous LT study completed.  Preliminary results relayed to [place], [place].   See CV Proc for preliminary results report.   Jean Rosenthal, RDMS, RVT

## 2020-12-07 NOTE — Discharge Instructions (Signed)
I have prescribed antibiotics to help with your foot infection, please take 1 tablet every 6 hours for the past 7 days.  I have also prescribed medication to help with your pain, please take 2 tablets every 4 hours for pain control as needed.

## 2020-12-07 NOTE — ED Provider Notes (Signed)
[facility]-EMERGENCY DEPT Provider Note   CSN: [id number] Arrival date & time: 12/07/20  1636     History Chief Complaint  Patient presents with  . Cellulitis    Kevin Reed is a 55 y.o. male.  55 y.o male with no PMH of Allergy, Anemia, Depression presents to the ED with a chief complaint of left foot pain for the past 3 days.  Patient reports pain along the medial aspect of the left foot, reports this is exacerbated with ambulation along with palpation.  He reports taking some over-the-counter medication, Advil without much improvement in his symptoms.  Reports an injury several weeks ago, when he had a fall on the front porch, unsure whether he hit his left foot.  He does report a prior history of cellulitis, similar symptoms on today's visit.  The history is provided by the patient.       Past Medical History:  Diagnosis Date  . Allergy   . Anemia   . Anxiety   . CTS (carpal tunnel syndrome)   . Depression   . Depression   . Fibromyalgia   . Hypertension   . Migraine   . Narcolepsy   . Neuromuscular disorder [facility])     Patient Active Problem List   Diagnosis Date Noted  . Hypersomnia, periodic 12/17/2012  . Depressive disorder, not elsewhere classified 09/28/2012  . Unspecified hereditary and idiopathic peripheral neuropathy 09/26/2012  . Carpal tunnel syndrome on both sides 10/14/2011  . Subacromial or subdeltoid bursitis 10/14/2011  . Myalgia and myositis, unspecified 10/14/2011  . OTHER CHRONIC PAIN 06/28/2010  . NARCOLEPSY WITH CATAPLEXY 06/28/2010  . HYPERSOMNIA, PERSISTENT 05/18/2010  . SLEEP APNEA 05/18/2010    Past Surgical History:  Procedure Laterality Date  . CARPAL TUNNEL RELEASE  10/2011  . 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 GASTRIC BYPASS         Family History  Problem Relation Age of Onset  . Cancer Father 80       colon and bladder cancer  . Diabetes Father   . Prostate cancer Father   . Colon  cancer Father   . Prostate cancer Brother   . Macular degeneration Mother   . Esophageal cancer Neg Hx   . Stomach cancer Neg Hx   . Rectal cancer Neg Hx     Social History   Tobacco Use  . Smoking status: Former Smoker    Packs/day: 0.30    Years: 30.00    Pack years: 9.00    Types: Cigarettes  . Smokeless tobacco: Never Used  . Tobacco comment: 1/2 pack daily  Vaping Use  . Vaping Use: Never used  Substance Use Topics  . Alcohol use: Yes    Comment: socially  . Drug use: No    Home Medications Prior to Admission medications   Medication Sig Start Date End Date Taking? Authorizing Provider  clindamycin (CLEOCIN) 300 MG capsule Take 1 capsule (300 mg total) by mouth every 6 (six) hours for 7 days. 12/07/20 12/14/20 Yes Jolanda Mccann, Leonie Douglas, PA-C  HYDROcodone-acetaminophen (NORCO/VICODIN) 5-325 MG tablet Take 2 tablets by mouth every 4 (four) hours as needed for up to 3 days. 12/07/20 12/10/20 Yes Javarian Jakubiak, PA-C  amLODipine (NORVASC) 10 MG tablet Take 10 mg by mouth daily.  03/16/18   [provider]  chlordiazePOXIDE (LIBRIUM) 25 MG capsule Take 25 mg by mouth 2 (two) times daily. 05/21/18   [provider]  cyanocobalamin (,VITAMIN B-12,) 1000  MCG/ML injection Inject 1,000 mcg into the muscle every 30 (thirty) days. Patient not taking: Reported on 04/22/2020    [provider]  FLUoxetine (PROZAC) 20 MG capsule Take 80 mg by mouth every morning. 04/20/20   [provider]  gabapentin (NEURONTIN) 600 MG tablet Take 600 mg by mouth at bedtime.  03/29/18   [provider]  guanFACINE (TENEX) 2 MG tablet Take 2 mg by mouth in the morning. Patient not taking: Reported on 04/22/2020    [provider]  hydrochlorothiazide (HYDRODIURIL) 25 MG tablet Take 25 mg by mouth daily. 03/11/18   [provider]  RIVAROXABAN Carlena Hurl(XARELTO) VTE STARTER PACK (15 & 20 MG TABLETS) Follow package directions: Take one 15mg  tablet by mouth twice a day. On day  22, switch to one 20mg  tablet once a day. Take with food. 12/04/19   Curatolo, Adam, DO    Allergies    Hydrocodone and Lamictal [lamotrigine]  Review of Systems   Review of Systems  Constitutional: Negative for fever.  HENT: Negative for sore throat.   Respiratory: Negative for shortness of breath.   Cardiovascular: Negative for chest pain.  Gastrointestinal: Negative for abdominal pain, nausea and vomiting.  Musculoskeletal: Negative for back pain.  Neurological: Negative for light-headedness and headaches.  All other systems reviewed and are negative.   Physical Exam Updated Vital Signs BP (!) 130/92   Pulse 82   Temp 98.4 F (36.9 C) (Oral)   Resp 20   Wt 119.7 kg   SpO2 96%   BMI 37.88 kg/m   Physical Exam Vitals and nursing note reviewed.  Constitutional:      Appearance: Normal appearance.  HENT:     Head: Normocephalic and atraumatic.     Nose: Nose normal.     Mouth/Throat:     Mouth: Mucous membranes are moist.  Eyes:     Pupils: Pupils are equal, round, and reactive to light.  Cardiovascular:     Rate and Rhythm: Normal rate.     Pulses:          Dorsalis pedis pulses are 2+ on the left side.       Posterior tibial pulses are 2+ on the left side.  Pulmonary:     Effort: Pulmonary effort is normal.     Breath sounds: No wheezing.  Abdominal:     General: Abdomen is flat.     Tenderness: There is no abdominal tenderness. There is no right CVA tenderness or left CVA tenderness.  Musculoskeletal:     Cervical back: Normal range of motion and neck supple.     Left foot: Normal range of motion. No deformity, bunion, Charcot foot or foot drop.  Feet:     Left foot:     Skin integrity: Callus present. No blister, skin breakdown or dry skin.     Toenail Condition: Left toenails are long.     Comments: Swelling to the left lower foot, pulses are present, mild pitting edema noted.  No streaking in the skin but erythema is present.  Neurological:     Mental  Status: He is alert and oriented to person, place, and time.     ED Results / Procedures / Treatments   Labs (all labs ordered are listed, but only abnormal results are displayed) Labs Reviewed  LACTIC ACID, PLASMA - Abnormal; Notable for the following components:      Result Value   Lactic Acid, Venous 2.4 (*)    All other components  within normal limits  COMPREHENSIVE METABOLIC PANEL - Abnormal; Notable for the following components:   Chloride 94 (*)    Glucose, Bld 105 (*)    Creatinine, Ser 1.28 (*)    All other components within normal limits  CBC WITH DIFFERENTIAL/PLATELET - Abnormal; Notable for the following components:   Hemoglobin 17.3 (*)    MCH 34.8 (*)    All other components within normal limits  LACTIC ACID, PLASMA  URINALYSIS, ROUTINE W REFLEX MICROSCOPIC    EKG None  Radiology DG Foot Complete Left  Result Date: 12/07/2020 CLINICAL DATA:  Left foot pain in arch and dorsal aspect since waking up EXAM: LEFT FOOT - COMPLETE 3+ VIEW COMPARISON:  None. FINDINGS: There is no evidence of fracture or dislocation. Mild dorsal midfoot degenerative change. Calcaneal enthesophytes. Soft tissues are unremarkable. IMPRESSION: No osseous abnormality. Electronically Signed   By: Maudry Mayhew MD   On: 12/07/2020 17:50   VAS [place] LOWER EXTREMITY VENOUS (DVT) (ONLY [facility] & [facility] 7a-7p)  Result Date: 12/07/2020  Lower Venous DVT Study Patient Name:  Kevin Reed  Date of Exam:   12/07/2020 Medical Rec #: [id number]      Accession #:    [id number] Date of Birth: 12/26/1965       Patient Gender: M Patient Age:   57Y Exam Location:  [facility] Procedure:      VAS [place] LOWER EXTREMITY VENOUS (DVT) Referring Phys: Leonie Douglas Katia Hannen --------------------------------------------------------------------------------  Indications: Pain, swelling, erythema, history of DVT.  Anticoagulation: Previously on Xarelto, currently not on anticoagulation. Comparison Study: 12-04-2019 Prior LT lower extremity  venous was positive for                   acute DVT involving the LT femoral vein, popliteal vein,                   posterior tibial veins, and peroneal veins. Performing Technologist: Jean Rosenthal RDMS,RVT  Examination Guidelines: A complete evaluation includes B-mode imaging, spectral Doppler, color Doppler, and power Doppler as needed of all accessible portions of each vessel. Bilateral testing is considered an integral part of a complete examination. Limited examinations for reoccurring indications may be performed as noted. The reflux portion of the exam is performed with the patient in reverse Trendelenburg.  +-----+---------------+---------+-----------+----------+--------------+ RIGHTCompressibilityPhasicitySpontaneityPropertiesThrombus Aging +-----+---------------+---------+-----------+----------+--------------+ CFV  Full           Yes      Yes                                 +-----+---------------+---------+-----------+----------+--------------+   +---------+---------------+---------+-----------+------------------+-----------+ LEFT     CompressibilityPhasicitySpontaneityProperties        Thrombus                                                                  Aging       +---------+---------------+---------+-----------+------------------+-----------+ CFV      Full           Yes      Yes                                      +---------+---------------+---------+-----------+------------------+-----------+  SFJ      Full                                                             +---------+---------------+---------+-----------+------------------+-----------+ FV Prox  Full                                                             +---------+---------------+---------+-----------+------------------+-----------+ FV Mid   Partial        Yes      Yes        Duplicated- one   Chronic                                                 patent, one with                                                           non-obstructive                                                           echogenic                                                                 stranding                     +---------+---------------+---------+-----------+------------------+-----------+ FV DistalPartial        Yes      Yes        Duplicated- one   Chronic                                                 patent, one with                                                          non-obstructive  echogenic                                                                 stranding                     +---------+---------------+---------+-----------+------------------+-----------+ PFV      Full                                                             +---------+---------------+---------+-----------+------------------+-----------+ POP      Full           Yes      Yes                                      +---------+---------------+---------+-----------+------------------+-----------+ PTV      Full                                                             +---------+---------------+---------+-----------+------------------+-----------+ PERO     Full                                                             +---------+---------------+---------+-----------+------------------+-----------+ Gastroc  Full                                                             +---------+---------------+---------+-----------+------------------+-----------+     Summary: RIGHT: - No evidence of common femoral vein obstruction.  LEFT: - Findings consistent with chronic deep vein thrombosis involving the left femoral vein. - No cystic structure found in the popliteal fossa.  *See table(s) above for measurements and observations.    Preliminary     Procedures Procedures    Medications Ordered in ED Medications  sodium chloride 0.9 % bolus 1,000 mL (1,000 mLs Intravenous Bolus 12/07/20 1847)  clindamycin (CLEOCIN) IVPB 600 mg (0 mg Intravenous Stopped 12/07/20 2002)  ketorolac (TORADOL) 15 MG/ML injection 15 mg (15 mg Intravenous Given 12/07/20 2001)    ED Course  I have reviewed the triage vital signs and the nursing notes.  Pertinent labs & imaging results that were available during my care of the patient were reviewed by me and considered in my medical decision making (see chart for details).    MDM Rules/Calculators/A&P   Patient presents to the ED with left foot pain the past 3 days.  Prior history of cellulitis to his right foot.  Reports pain is  along the medial aspect, there has been some increased swelling.  No prior history of MRSA, no prior history of gout.  Did have a prior history of DVT, was on prophylactic Xarelto, however has not been able to take this medication due to financial strain.  During primary evaluation patient is overall comfortable, vital signs are within normal limits, he is afebrile on arrival.  Pulses present, mild swelling noted to the area with some mild pitting edema.  Pain is along the medial aspect of the foot.  Interpretation of his blood work reveal a CBC without any leukocytosis, he is afebrile, have a lower suspicion for sepsis at this time.  Hemoglobin slightly increased.  CMP without any electrolyte derangement, creatinine level remarkable for a mild AKI, he is receiving a liter of fluids to help with this.  LFTs are within normal limits.  Lactic acid is negative.  I did obtain an x-ray of his left foot which showed  No osteomyelitis.  Ultrasound of his lower extremities negative for any DVTs.  He received clindamycin while in the ED, was given Toradol for pain control.  SPECT likely the starting of cellulitis, will go home on a short course of clindamycin along with pain control with hydrocodone.  No suspicion for  sepsis at this time, x-ray without any osteomyelitis, no DVT.  These results were discussed with patient, return precautions discussed at length.  Patient stable for discharge   Portions of this note were generated with Dragon dictation software. Dictation errors may occur despite best attempts at proofreading.  Final Clinical Impression(s) / ED Diagnoses Final diagnoses:  Cellulitis of left lower extremity    Rx / DC Orders ED Discharge Orders         Ordered    clindamycin (CLEOCIN) 300 MG capsule  Every 6 hours        12/07/20 2032    HYDROcodone-acetaminophen (NORCO/VICODIN) 5-325 MG tablet  Every 4 hours PRN        12/07/20 2040           [name], Cordelia Poche 12/07/20 2044    Lorre Nick, MD 12/08/20 1932

## 2021-05-15 DEATH — deceased
